# Patient Record
Sex: Female | Born: 1965 | Race: White | Hispanic: No | Marital: Married | Smoking: Never smoker
Health system: Southern US, Community
[De-identification: ages and names within clinical notes are randomized; demographics above are authoritative.]

## PROBLEM LIST (undated history)

## (undated) DIAGNOSIS — IMO0002 Reserved for concepts with insufficient information to code with codable children: Secondary | ICD-10-CM

## (undated) DIAGNOSIS — Z9889 Other specified postprocedural states: Secondary | ICD-10-CM

## (undated) DIAGNOSIS — M069 Rheumatoid arthritis, unspecified: Secondary | ICD-10-CM

## (undated) DIAGNOSIS — M329 Systemic lupus erythematosus, unspecified: Secondary | ICD-10-CM

## (undated) DIAGNOSIS — R112 Nausea with vomiting, unspecified: Secondary | ICD-10-CM

## (undated) DIAGNOSIS — B1920 Unspecified viral hepatitis C without hepatic coma: Secondary | ICD-10-CM

## (undated) DIAGNOSIS — C50919 Malignant neoplasm of unspecified site of unspecified female breast: Secondary | ICD-10-CM

## (undated) DIAGNOSIS — Z9289 Personal history of other medical treatment: Secondary | ICD-10-CM

## (undated) DIAGNOSIS — Z87820 Personal history of traumatic brain injury: Secondary | ICD-10-CM

## (undated) DIAGNOSIS — Z970 Presence of artificial eye: Secondary | ICD-10-CM

## (undated) HISTORY — PX: BRAIN SURGERY: SHX531

## (undated) HISTORY — DX: Rheumatoid arthritis, unspecified: M06.9

## (undated) HISTORY — PX: ANTERIOR CERVICAL DECOMP/DISCECTOMY FUSION: SHX1161

## (undated) HISTORY — DX: Reserved for concepts with insufficient information to code with codable children: IMO0002

## (undated) HISTORY — PX: CLOSED REDUCTION RADIAL HEAD / NECK FRACTURE: SUR238

## (undated) HISTORY — DX: Systemic lupus erythematosus, unspecified: M32.9

---

## 1985-10-01 DIAGNOSIS — Z9289 Personal history of other medical treatment: Secondary | ICD-10-CM

## 1985-10-01 HISTORY — PX: KNEE ARTHROCENTESIS: SUR44

## 1985-10-01 HISTORY — PX: ENUCLEATION: SHX628

## 1985-10-01 HISTORY — DX: Personal history of other medical treatment: Z92.89

## 1985-10-01 HISTORY — PX: EXPLORATORY LAPAROTOMY: SUR591

## 1998-06-01 ENCOUNTER — Other Ambulatory Visit: Admission: RE | Admit: 1998-06-01 | Discharge: 1998-06-01 | Payer: Self-pay | Admitting: Gynecology

## 1999-07-24 ENCOUNTER — Inpatient Hospital Stay (HOSPITAL_COMMUNITY): Admission: AD | Admit: 1999-07-24 | Discharge: 1999-07-24 | Payer: Self-pay | Admitting: Obstetrics and Gynecology

## 1999-07-25 ENCOUNTER — Inpatient Hospital Stay (HOSPITAL_COMMUNITY): Admission: AD | Admit: 1999-07-25 | Discharge: 1999-07-25 | Payer: Self-pay | Admitting: Obstetrics & Gynecology

## 1999-08-06 ENCOUNTER — Inpatient Hospital Stay (HOSPITAL_COMMUNITY): Admission: AD | Admit: 1999-08-06 | Discharge: 1999-08-08 | Payer: Self-pay | Admitting: Obstetrics and Gynecology

## 1999-09-14 ENCOUNTER — Other Ambulatory Visit: Admission: RE | Admit: 1999-09-14 | Discharge: 1999-09-14 | Payer: Self-pay | Admitting: Obstetrics and Gynecology

## 2000-09-12 ENCOUNTER — Other Ambulatory Visit: Admission: RE | Admit: 2000-09-12 | Discharge: 2000-09-12 | Payer: Self-pay | Admitting: Gynecology

## 2002-01-01 ENCOUNTER — Inpatient Hospital Stay (HOSPITAL_COMMUNITY): Admission: AD | Admit: 2002-01-01 | Discharge: 2002-01-03 | Payer: Self-pay | Admitting: Obstetrics and Gynecology

## 2002-02-27 ENCOUNTER — Other Ambulatory Visit: Admission: RE | Admit: 2002-02-27 | Discharge: 2002-02-27 | Payer: Self-pay | Admitting: Obstetrics and Gynecology

## 2003-03-05 ENCOUNTER — Other Ambulatory Visit: Admission: RE | Admit: 2003-03-05 | Discharge: 2003-03-05 | Payer: Self-pay | Admitting: Obstetrics and Gynecology

## 2003-06-25 ENCOUNTER — Ambulatory Visit (HOSPITAL_COMMUNITY): Admission: RE | Admit: 2003-06-25 | Discharge: 2003-06-25 | Payer: Self-pay | Admitting: Gastroenterology

## 2004-05-05 ENCOUNTER — Other Ambulatory Visit: Admission: RE | Admit: 2004-05-05 | Discharge: 2004-05-05 | Payer: Self-pay | Admitting: Obstetrics and Gynecology

## 2004-07-30 ENCOUNTER — Ambulatory Visit (HOSPITAL_COMMUNITY): Admission: RE | Admit: 2004-07-30 | Discharge: 2004-07-30 | Payer: Self-pay | Admitting: Internal Medicine

## 2004-09-18 ENCOUNTER — Ambulatory Visit (HOSPITAL_COMMUNITY): Admission: RE | Admit: 2004-09-18 | Discharge: 2004-09-18 | Payer: Self-pay | Admitting: Neurosurgery

## 2005-09-26 ENCOUNTER — Other Ambulatory Visit: Admission: RE | Admit: 2005-09-26 | Discharge: 2005-09-26 | Payer: Self-pay | Admitting: Obstetrics and Gynecology

## 2006-11-12 ENCOUNTER — Ambulatory Visit: Payer: Self-pay | Admitting: Gastroenterology

## 2006-12-25 ENCOUNTER — Ambulatory Visit (HOSPITAL_COMMUNITY): Admission: RE | Admit: 2006-12-25 | Discharge: 2006-12-25 | Payer: Self-pay | Admitting: Gastroenterology

## 2006-12-25 ENCOUNTER — Encounter (INDEPENDENT_AMBULATORY_CARE_PROVIDER_SITE_OTHER): Payer: Self-pay | Admitting: Specialist

## 2007-03-13 ENCOUNTER — Ambulatory Visit: Payer: Self-pay | Admitting: Gastroenterology

## 2007-06-13 ENCOUNTER — Ambulatory Visit (HOSPITAL_COMMUNITY): Admission: RE | Admit: 2007-06-13 | Discharge: 2007-06-13 | Payer: Self-pay | Admitting: Geriatric Medicine

## 2007-10-20 ENCOUNTER — Encounter: Admission: RE | Admit: 2007-10-20 | Discharge: 2007-10-20 | Payer: Self-pay | Admitting: Gastroenterology

## 2008-10-26 ENCOUNTER — Encounter: Admission: RE | Admit: 2008-10-26 | Discharge: 2008-10-26 | Payer: Self-pay | Admitting: Gastroenterology

## 2010-10-21 ENCOUNTER — Encounter: Payer: Self-pay | Admitting: Internal Medicine

## 2011-02-16 NOTE — Op Note (Signed)
   Kendra Sullivan, Kendra Sullivan                         ACCOUNT NO.:  [id number]   MEDICAL RECORD NO.:  [id number]                   PATIENT TYPE:  AMB   LOCATION:  ENDO                                 FACILITY:  [facility]   PHYSICIAN:  Anselmo Rod, M.D.               DATE OF BIRTH:  08/17/1966   DATE OF PROCEDURE:  06/25/2003  DATE OF DISCHARGE:  06/25/2003                                 OPERATIVE REPORT   PROCEDURE PERFORMED:  Screening colonoscopy.   ENDOSCOPIST:  Charna Elizabeth, M.D.   INSTRUMENT USED:  Olympus video colonoscope.   INDICATIONS FOR PROCEDURE:  Rectal bleeding in a 45 year old white female.  Rule out colonic polyps, masses, etc.  The patient has a family history of  colon cancer  as well.   PREPROCEDURE PREPARATION:  Informed consent was procured from the patient.  The patient was fasted for eight hours prior to the procedure and prepped  with a bottle of magnesium citrate and a gallon of GoLYTELY the night prior  to the procedure.   PREPROCEDURE PHYSICAL:  The patient had stable vital signs.  Neck supple.  Chest clear to auscultation.  S1 and S2 regular.  Abdomen soft with normal  bowel sounds.   DESCRIPTION OF PROCEDURE:  The patient was placed in left lateral decubitus  position and sedated with 70 mg of Demerol and 7 mg of Versed intravenously.  Once the patient was adequately sedated and maintained on low flow oxygen  and continuous cardiac monitoring, the Olympus video colonoscope was  advanced from the rectum to the cecum without difficulty.  The patient had a  fairly good prep, no masses, polyps, erosions, ulcerations or diverticula  were seen.  Small internal hemorrhoids were appreciated on retroflexion in  the rectum. The patient tolerated the procedure well without complications.  Terminal ileum was visualized and appeared normal.   IMPRESSION:  Normal colonoscopy up to the terminal ileum except for small  internal hemorrhoids.   RECOMMENDATIONS:  1.  Continue high fiber diet.  2. Repeat colorectal cancer screening is recommended in the next five years     unless the patient develops any abnormal symptoms in the interim.  3. Outpatient follow-up in the next two weeks or earlier if need be.                                                   Anselmo Rod, M.D.    JNM/MEDQ  D:  06/25/2003  T:  06/28/2003  Job:  [id number]   cc:   Marcelino Duster L. Vincente Poli, M.D.  [address] [place]  [place] [address]  Fax: [phone]

## 2011-02-16 NOTE — Op Note (Signed)
Sullivan, KISTNER               ACCOUNT NO.:  [id number]   MEDICAL RECORD NO.:  [id number]          PATIENT TYPE:  OIB   LOCATION:  3014                         FACILITY:  [facility]   PHYSICIAN:  Kathaleen Maser. Pool, M.D.    DATE OF BIRTH:  11-04-1965   DATE OF PROCEDURE:  09/18/2004  DATE OF DISCHARGE:                                 OPERATIVE REPORT   SERVICE:  Neurosurgery.   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   POSTOPERATIVE DIAGNOSIS:  Right C5-C6 and right C6-C7 herniated pulposus  with radiculopathy.   PROCEDURE PERFORMED:  C5-C6 and C6-C7 anterior discectomy and fusion with  allograft anterior plating.   SURGEON:  Kathaleen Maser. Pool, M.D.   ASSISTANT:  Tia Alert, M.D.   ANESTHESIA:  General endotracheal anesthesia.   INDICATIONS FOR PROCEDURE:  Ms. Theissen is a 45 year old female with a  history of neck and right upper extremity pain associated with both right  sided C6 and C7 radiculopathy failing conservative management.  Workup  demonstrates evidence of significant rightward disc herniation at C5-C6 and  C6-C7 with marked neural foraminal narrowing and stenosis.  Kendra patient  presents now for two level anterior cervical discectomy and fusion with  allograft plating in hopes of improving her symptoms.   PROCEDURE:  Kendra patient was brought to Kendra operating room and placed on Kendra  operating table in Kendra supine position.  After an adequate level of  anesthesia was achieved, Kendra patient was positioned with her neck slightly  extended and held in place with Kendra halter traction.  Kendra patient's anterior  cervical region was prepped and draped sterilely.  A 10 blade was used to  make a linear skin incision overlying Kendra C6 vertebral body.  This was  carried down sharply to Kendra platysma.  Kendra platysma was divided vertically  and dissection proceeded along Kendra medial border of Kendra sternocleidomastoid  muscle and carotid  sheath.  Kendra trachea and esophagus were mobilized and  directed toward Kendra left.  Kendra prevertebral fascia was stripped off Kendra  anterior spinal column.  Kendra longus colli muscles were elevated bilaterally  using electrocautery.  Deep self-retaining retractors were placed.  Interoperative fluoroscopy was used and Kendra C5-C6 and C6-C7 levels were  confirmed.   Kendra disc space was incised at both levels with a 15 blade in a rectangular  fashion and wide disc space clean out was then achieved using pituitary  rongeurs, forward and back cutting Carlens curets, Kerrison rongeurs, and a  high speed drill.  All elements of Kendra disc were removed down to Kendra level  of Kendra posterior annulus.  Kendra microscope was brought onto Kendra field and  used throughout Kendra remainder of Kendra discectomy.  Kendra remaining aspects of  Kendra annulus and osteophytes were removed using Kendra high speed drill down to  Kendra level of Kendra posterior longitudinal ligament.  Kendra posterior  longitudinal ligament was then elevated and resected in piecemeal fashion  using Kerrison rongeurs.  Kendra underlying thecal sac was identified.  A wide  central decompression  was then performed by under cutting Kendra bodies of C5  and C6 using Kerrison rongeurs.  Decompression then proceeded out each  neural foramen.  Wide anterior foraminotomies were then performed along Kendra  course of Kendra exiting C6 nerve roots bilaterally.  All elements of Kendra disc  herniation were completely resected on Kendra patient's right side.  At Kendra  conclusion of Kendra decompression, Kendra thecal sac and nerve roots were well  decompressed and free from injury.  Kendra procedure was then repeated at C6-  C7, again, without complications.   Kendra wound was then irrigated with antibiotic solution, Gelfoam was placed  topically, and hemostasis was found to be good.  A 6 mm patellar wedge  allograft was then impacted into place and recessed approximately 1 mm from  Kendra anterior cortical  margin at both levels.  A 42 mm Atlantis anterior  cervical plate was then placed over Kendra C5, C6 and C7 levels.  This was then  attached under fluoroscopic guidance using 13 mm variable angle screws, two  each at all three levels.  All screws were given final tightening and found  to be solid in bone.  Locking screws were engaged at all three levels.  Kendra  final images revealed good position of bone grafts and hardware.  Kendra wound  was then irrigated with antibiotic solution.  Hemostasis was achieved with  bipolar electrocautery.  Kendra wounds were closed in typical fashion.  Steri-  Strips and sterile dressing were applied.  There were no complications.  Kendra  patient tolerated Kendra procedure well and she was returned to Kendra recovery  room in stable condition.       HAP/MEDQ  D:  09/18/2004  T:  09/18/2004  Job:  [id number]

## 2011-03-28 ENCOUNTER — Emergency Department (HOSPITAL_COMMUNITY): Payer: BC Managed Care – PPO

## 2011-03-28 ENCOUNTER — Emergency Department (HOSPITAL_COMMUNITY)
Admission: EM | Admit: 2011-03-28 | Discharge: 2011-03-29 | Disposition: A | Discharge status: Home / Self Care | Payer: BC Managed Care – PPO | Attending: Emergency Medicine | Admitting: Emergency Medicine

## 2011-03-28 DIAGNOSIS — R05 Cough: Secondary | ICD-10-CM | POA: Insufficient documentation

## 2011-03-28 DIAGNOSIS — J40 Bronchitis, not specified as acute or chronic: Secondary | ICD-10-CM | POA: Insufficient documentation

## 2011-03-28 DIAGNOSIS — R071 Chest pain on breathing: Secondary | ICD-10-CM | POA: Insufficient documentation

## 2011-03-28 DIAGNOSIS — J984 Other disorders of lung: Secondary | ICD-10-CM | POA: Insufficient documentation

## 2011-03-28 DIAGNOSIS — M329 Systemic lupus erythematosus, unspecified: Secondary | ICD-10-CM | POA: Insufficient documentation

## 2011-03-28 DIAGNOSIS — M069 Rheumatoid arthritis, unspecified: Secondary | ICD-10-CM | POA: Insufficient documentation

## 2011-03-28 DIAGNOSIS — R059 Cough, unspecified: Secondary | ICD-10-CM | POA: Insufficient documentation

## 2011-03-28 DIAGNOSIS — B1920 Unspecified viral hepatitis C without hepatic coma: Secondary | ICD-10-CM | POA: Insufficient documentation

## 2011-03-28 DIAGNOSIS — R0602 Shortness of breath: Secondary | ICD-10-CM | POA: Insufficient documentation

## 2011-03-28 LAB — CBC
HCT: 42.2 % (ref 36.0–46.0)
Hemoglobin: 13.4 g/dL (ref 12.0–15.0)
MCH: 29.1 pg (ref 26.0–34.0)
MCHC: 31.8 g/dL (ref 30.0–36.0)
MCV: 91.5 fL (ref 78.0–100.0)
Platelets: 288 10*3/uL (ref 150–400)
RBC: 4.61 MIL/uL (ref 3.87–5.11)
RDW: 13.2 % (ref 11.5–15.5)
WBC: 9 10*3/uL (ref 4.0–10.5)

## 2011-03-28 LAB — TROPONIN I: Troponin I: <0.3 ng/mL (ref <0.30)

## 2011-03-29 ENCOUNTER — Encounter (HOSPITAL_COMMUNITY): Payer: Self-pay

## 2011-03-29 LAB — COMPREHENSIVE METABOLIC PANEL
ALT: 14 U/L (ref 0–35)
AST: 14 U/L (ref 0–37)
Albumin: 3.6 g/dL (ref 3.5–5.2)
Alkaline Phosphatase: 79 U/L (ref 39–117)
BUN: 13 mg/dL (ref 6–23)
CO2: 32 mEq/L (ref 19–32)
Calcium: 9.2 mg/dL (ref 8.4–10.5)
Chloride: 102 mEq/L (ref 96–112)
Creatinine, Ser: 0.8 mg/dL (ref 0.50–1.10)
GFR calc Af Amer: >60 mL/min (ref >60)
GFR calc non Af Amer: >60 mL/min (ref >60)
Glucose, Bld: 97 mg/dL (ref 70–99)
Potassium: 4 mEq/L (ref 3.5–5.1)
Sodium: 141 mEq/L (ref 135–145)
Total Bilirubin: 0.2 mg/dL — ABNORMAL LOW (ref 0.3–1.2)
Total Protein: 7.6 g/dL (ref 6.0–8.3)

## 2011-03-29 LAB — CK TOTAL AND CKMB (NOT AT ARMC)
CK, MB: 1.5 ng/mL (ref 0.3–4.0)
Relative Index: INVALID (ref 0.0–2.5)
Total CK: 32 U/L (ref 7–177)

## 2011-03-29 MED ORDER — IOHEXOL 300 MG/ML  SOLN
80.0000 mL | Freq: Once | INTRAMUSCULAR | Status: AC | PRN
  Administered 2011-03-29: 80 mL via INTRAVENOUS

## 2013-01-22 ENCOUNTER — Other Ambulatory Visit: Payer: Self-pay | Admitting: Obstetrics and Gynecology

## 2014-03-01 HISTORY — PX: BREAST CYST EXCISION: SHX579

## 2014-03-12 ENCOUNTER — Telehealth (INDEPENDENT_AMBULATORY_CARE_PROVIDER_SITE_OTHER): Payer: Self-pay

## 2014-03-12 NOTE — Telephone Encounter (Signed)
I spoke with pt to verify of appt date is ok. Pt states she is fine with this date. She does not want to move to a sooner date with another MD. Pt will bring CD of imaging. Records are here.

## 2014-03-12 NOTE — Telephone Encounter (Signed)
Records here and to new ref coord to make appt with Dr Harlow Asa and return records to me to be attached to date of appt.

## 2014-04-06 ENCOUNTER — Other Ambulatory Visit: Payer: Self-pay | Admitting: Obstetrics and Gynecology

## 2014-04-07 LAB — CYTOLOGY - PAP

## 2014-04-13 ENCOUNTER — Ambulatory Visit (INDEPENDENT_AMBULATORY_CARE_PROVIDER_SITE_OTHER): Payer: Medicaid Other | Admitting: Surgery

## 2014-04-13 ENCOUNTER — Other Ambulatory Visit (INDEPENDENT_AMBULATORY_CARE_PROVIDER_SITE_OTHER): Payer: Self-pay

## 2014-04-13 ENCOUNTER — Telehealth (INDEPENDENT_AMBULATORY_CARE_PROVIDER_SITE_OTHER): Payer: Self-pay

## 2014-04-13 ENCOUNTER — Encounter (INDEPENDENT_AMBULATORY_CARE_PROVIDER_SITE_OTHER): Payer: Self-pay | Admitting: Surgery

## 2014-04-13 VITALS — BP 122/76 | HR 93 | Temp 97.5°F | Ht 66.0 in | Wt 108.0 lb

## 2014-04-13 DIAGNOSIS — C50912 Malignant neoplasm of unspecified site of left female breast: Secondary | ICD-10-CM

## 2014-04-13 DIAGNOSIS — Z170 Estrogen receptor positive status [ER+]: Secondary | ICD-10-CM | POA: Insufficient documentation

## 2014-04-13 DIAGNOSIS — C50419 Malignant neoplasm of upper-outer quadrant of unspecified female breast: Secondary | ICD-10-CM

## 2014-04-13 DIAGNOSIS — C50412 Malignant neoplasm of upper-outer quadrant of left female breast: Secondary | ICD-10-CM

## 2014-04-13 NOTE — Telephone Encounter (Signed)
Copy of records and ref request given to ref coord to set up appt asap with Dr Jana Hakim. Pt has imaging CD with her.

## 2014-04-13 NOTE — Progress Notes (Signed)
[facility]  Chief Complaint  Patient presents with  . New Evaluation    evaluate newly diagnosed left breast cancer - patient referred by her mother, a patient of mine    HISTORY: Patient is a 48 year old female who presents with newly diagnosed left breast invasive ductal carcinoma. Patient had noted a palpable mass in the upper outer quadrant of the left breast. This was initially thought to represent a cyst or a fibroadenoma. Diagnostic studies were performed and the patient was referred to a surgeon. She underwent excisional biopsy under local anesthesia in the office. Final pathology showed invasive ductal carcinoma with positive surgical margins. Tumor measured approximately 2 cm. Receptor studies were performed and showed estrogen receptor positive and progesterone receptor positive.  Do to family concerns, childcare issues, and the patient's past affiliation with the medical community hearing [facility], the patient desires to transfer her care to our community. [name] has a history of breast cancer and has undergone bilateral mastectomy with reconstruction. Her mother accompanies her today.  Patient has had extensive diagnostic studies including bone scan and MRI at an outside facility. She brings a copy of those studies with her today. They showed no sign of metastatic disease.  Past Medical History  Diagnosis Date  . Cancer     Current Outpatient Prescriptions  Medication Sig Dispense Refill  . hydroxychloroquine (PLAQUENIL) 200 MG tablet Take by mouth daily.      . predniSONE (DELTASONE) 10 MG tablet Take 10 mg by mouth daily with breakfast.       No current facility-administered medications for this visit.    Allergies  Allergen Reactions  . Aspirin   . Penicillins   . [place] [Sulfur]     History reviewed. No pertinent family history.  History   Social History  . Marital Status: Married    Spouse Name: N/A   Number of Children: N/A  . Years of Education: N/A   Social History Main Topics  . Smoking status: Never Smoker   . Smokeless tobacco: None  . Alcohol Use: No  . Drug Use: None  . Sexual Activity: None   Other Topics Concern  . None   Social History Narrative  . None    REVIEW OF SYSTEMS - PERTINENT POSITIVES ONLY: Denies pain. Denies further masses. Denies problems with wound healing.  EXAM: Filed Vitals:   04/13/14 1022  BP: 122/76  Pulse: 93  Temp: 97.5 F (36.4 C)    GENERAL: well-developed, well-nourished, very thin, no acute distress HEENT: normocephalic; pupils equal and reactive; sclerae clear; dentition good; mucous membranes moist NECK:  No palpable masses in the thyroid bed; symmetric on extension; no palpable anterior or posterior cervical lymphadenopathy; no supraclavicular masses; no tenderness CHEST: clear to auscultation bilaterally without rales, rhonchi, or wheezes CARDIAC: regular rate and rhythm without significant murmur; peripheral pulses are full BREAST: Left breast has an inverted nipple which is chronic; well-healed 2 cm surgical incision upper-outer quadrant at the edge of the pectoralis major muscle; no sign of infection nor seroma; breast parenchyma is diffusely nodular without discrete or dominant mass; left axilla has palpable 1.5-2 cm mobile soft nontender lymph nodes. Right breast shows an inverted nipple which is chronic; again there is diffusely nodular breast parenchyma without discrete or dominant mass; there is thickening in the upper outer quadrant consistent with fibrocystic change; right axilla is without significant lymphadenopathy. EXT:  non-tender without edema; no deformity NEURO: no gross focal deficits; no  sign of tremor   LABORATORY RESULTS: See [facility] ([facility]) for most recent results  RADIOLOGY RESULTS: See [facility] ([place]) for most recent results  IMPRESSION: Left breast carcinoma, 2 cm, invasive  ductal  PLAN: I had a lengthy discussion with the patient and her mother in the office today. We reviewed her outside medical records and studies. We discussed her potential options for surgical management. Options include partial mastectomy with sentinel lymph node biopsy, total mastectomy with sentinel lymph node biopsy, and bilateral mastectomy with sentinel lymph node biopsy. Reconstruction may be an option if a mastectomy is the desired procedure.  Patient has not been seen by medical oncology. Patient requested see Dr. Gunnar Bulla Magrinat as she has had a relationship to his practice in the past.  We will make referral to the cancer center for this consultation in the immediate future.  We will arrange consultation with Dr. Crissie Reese from plastic surgery after the patient is evaluated at the cancer center.  Patient will likely be presented at the breast cancer conference in the near future.  [name], MD, [facility]  Primary Care Physician: No primary provider on file.

## 2014-04-15 NOTE — Telephone Encounter (Signed)
Pt called stating Kendra Sullivan would not make appt without Dr Jana Hakim reviewing records first. Records faxed to [phone] atten: Kendra Sullivan  per request of pt.

## 2014-04-20 ENCOUNTER — Encounter: Payer: Self-pay | Admitting: *Deleted

## 2014-04-20 ENCOUNTER — Other Ambulatory Visit: Payer: Self-pay | Admitting: *Deleted

## 2014-04-20 DIAGNOSIS — C50412 Malignant neoplasm of upper-outer quadrant of left female breast: Secondary | ICD-10-CM

## 2014-04-20 NOTE — Progress Notes (Signed)
Chart completed, labs entered, added to spreadsheet & placed in Dr. Virgie Dad box.

## 2014-04-21 ENCOUNTER — Other Ambulatory Visit (HOSPITAL_BASED_OUTPATIENT_CLINIC_OR_DEPARTMENT_OTHER): Payer: Medicaid Other

## 2014-04-21 ENCOUNTER — Ambulatory Visit: Payer: BC Managed Care – PPO

## 2014-04-21 ENCOUNTER — Ambulatory Visit (HOSPITAL_BASED_OUTPATIENT_CLINIC_OR_DEPARTMENT_OTHER): Payer: Medicaid Other | Admitting: Oncology

## 2014-04-21 VITALS — BP 139/93 | HR 97 | Temp 98.7°F | Resp 18 | Ht 66.0 in | Wt 111.4 lb

## 2014-04-21 DIAGNOSIS — C50419 Malignant neoplasm of upper-outer quadrant of unspecified female breast: Secondary | ICD-10-CM

## 2014-04-21 DIAGNOSIS — C50412 Malignant neoplasm of upper-outer quadrant of left female breast: Secondary | ICD-10-CM

## 2014-04-21 DIAGNOSIS — Z170 Estrogen receptor positive status [ER+]: Secondary | ICD-10-CM

## 2014-04-21 LAB — COMPREHENSIVE METABOLIC PANEL (CC13)
ALT: 15 U/L (ref 0–55)
AST: 17 U/L (ref 5–34)
Albumin: 3.2 g/dL — ABNORMAL LOW (ref 3.5–5.0)
Alkaline Phosphatase: 64 U/L (ref 40–150)
Anion Gap: 9 mEq/L (ref 3–11)
BUN: 10.2 mg/dL (ref 7.0–26.0)
CO2: 25 mEq/L (ref 22–29)
Calcium: 8.7 mg/dL (ref 8.4–10.4)
Chloride: 107 mEq/L (ref 98–109)
Creatinine: 0.9 mg/dL (ref 0.6–1.1)
Glucose: 107 mg/dl (ref 70–140)
Potassium: 3.6 mEq/L (ref 3.5–5.1)
Sodium: 141 mEq/L (ref 136–145)
Total Bilirubin: 0.2 mg/dL (ref 0.20–1.20)
Total Protein: 7.2 g/dL (ref 6.4–8.3)

## 2014-04-21 LAB — CBC WITH DIFFERENTIAL/PLATELET
BASO%: 0.2 % (ref 0.0–2.0)
Basophils Absolute: 0 10*3/uL (ref 0.0–0.1)
EOS%: 1 % (ref 0.0–7.0)
Eosinophils Absolute: 0.1 10*3/uL (ref 0.0–0.5)
HCT: 34.6 % — ABNORMAL LOW (ref 34.8–46.6)
HGB: 10.6 g/dL — ABNORMAL LOW (ref 11.6–15.9)
LYMPH%: 5.8 % — ABNORMAL LOW (ref 14.0–49.7)
MCH: 24.1 pg — ABNORMAL LOW (ref 25.1–34.0)
MCHC: 30.6 g/dL — ABNORMAL LOW (ref 31.5–36.0)
MCV: 78.6 fL — ABNORMAL LOW (ref 79.5–101.0)
MONO#: 0.3 10*3/uL (ref 0.1–0.9)
MONO%: 2.7 % (ref 0.0–14.0)
NEUT#: 10 10*3/uL — ABNORMAL HIGH (ref 1.5–6.5)
NEUT%: 90.3 % — ABNORMAL HIGH (ref 38.4–76.8)
Platelets: 402 10*3/uL — ABNORMAL HIGH (ref 145–400)
RBC: 4.41 10*6/uL (ref 3.70–5.45)
RDW: 15.4 % — ABNORMAL HIGH (ref 11.2–14.5)
WBC: 11.1 10*3/uL — ABNORMAL HIGH (ref 3.9–10.3)
lymph#: 0.6 10*3/uL — ABNORMAL LOW (ref 0.9–3.3)

## 2014-04-21 NOTE — Progress Notes (Signed)
Kendra Sullivan  [phone] [phone]) [phone]     ID: Kendra Sullivan DOB: 1965-12-27  MR#: [id number]  BSW#:[id number]  PCP: No primary provider on file. Dr [name] GYN: [name] SU: Kendra Sullivan) OTHER MD:   CHIEF COMPLAINT: Newly diagnosed breast cancer  CURRENT TREATMENT: Awaiting surgery   BREAST CANCER HISTORY: Kendra Sullivan herself noted a lump in her left breast, upper outer quadrant, and brought it to the attention of her primary care physician, [name], who initially suspected a fibroadenoma of. Mammography was obtained at [facility] [phone] and a breasts are described as "extremely dense. There were no obvious masses or calcifications. Because of the palpable abnormality and the patient's family history of breast cancer a left breast ultrasound was obtained 01/19/2014. This showed a mass that was taller than wide measuring 1.4 cm, lobulated, [name], but the patient preferred to go directly to excisional biopsy and this was performed at [facility] in [place] 02/19/2014, and showed ([phone]) and invasive ductal carcinoma, grade 2, measuring 2.0 cm, estrogen receptor 92% positive, progesterone receptor 50% positive, both with moderate staining, [name], with an MIB-1 of 11%.  The patient then underwent extensive staging studies including a CT of the chest with contrast 03/03/2014, CT of the abdomen and pelvis with contrast the same day bilateral breast MRI 03/03/2014 showing, in the left breast, no suspicious mass or non-masslike enhancement and no suspicious adenopathy. The right breast appeared normal. Bone scan was also obtained the same day and aside from mild wrist arthritis there was no evidence of metastatic disease.  The patient's subsequent history is as detailed below  INTERVAL HISTORY: Kendra Sullivan was evaluated in the  breast cancer clinic 04/21/2014 accompanied by her mother Kendra Sullivan ([id number]), who is my former patient, with a history of breast cancer.  REiVIEW OF SYSTEMS: Aside from the mass itself, there were no systemic symptoms leading to the initial mammogram. The patient denies unusual headaches, visual changes, nausea, vomiting, stiff neck, dizziness, or gait imbalance. There has been no cough, phlegm production, or pleurisy, no chest pain or pressure, and no change in bowel or bladder habits. The patient denies fever, rash, bleeding, unexplained fatigue or unexplained weight loss. The patient admits to some wrist ankle and finger her pain from her rheumatoid arthritis, but there has been no recent "flare" and overall her disease is well controlled on her hydroxychloroquine and prednisone. A detailed review of systems was otherwise entirely negative.  PAST MEDICAL HISTORY: Past Medical History  Diagnosis Date  . Cancer   . Rheumatoid arthritis   . Lupus     PAST SURGICAL HISTORY: Past Surgical History  Procedure Laterality Date  . Knee arthrocentesis    . Neck dissection    . Closed reduction radial head / neck fracture    . Eye surgery      FAMILY HISTORY No family history on file. As of July of 2015 both of the patient's parents are living, her father being 60, and her mother is 6. As noted, the patient's mother, Kendra Sullivan has a history of breast cancer, diagnosed in her 98s, [name], grade 2, triple positive invasive ductal carcinoma. She received Herceptin for one year and anastrozole for 5. She was discharged from followup in July of 2012. In addition the patient has one brother. She has no  sisters. Aside from the patient's mother, the patient's maternal grandmother was diagnosed with colon cancer at 43 as well as a maternal uncle and 22. Also a maternal cousin was diagnosed with breast cancer at the age of 40.  GYNECOLOGIC  HISTORY:  No LMP recorded. Menarche age 61, first live birth age 45, which the patient understands increases the risk of breast cancer. The patient is [name] P2. Her most recent period was June of 2015 and her periods are still regular.  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 Her husband Kendra Sullivan (goes by [employer]") works as an Chief Financial Officer. They have 2 children, a, Kendra Sullivan, 88, and Kendra Sullivan, 12.    ADVANCED DIRECTIVES: In place   HEALTH MAINTENANCE: History  Substance Use Topics  . Smoking status: Never Smoker   . Smokeless tobacco: Not on file  . Alcohol Use: No     Colonoscopy: Age 1/ under Dr.Robert Reindollar   PAP: July 2015  Bone density:  Lipid panel:  Allergies  Allergen Reactions  . Aspirin   . Penicillins   . Sulphur [Sulfur]     Current Outpatient Prescriptions  Medication Sig Dispense Refill  . hydroxychloroquine (PLAQUENIL) 200 MG tablet Take by mouth daily.      . predniSONE (DELTASONE) 10 MG tablet Take 10 mg by mouth daily with breakfast.       No current facility-administered medications for this visit.    OBJECTIVE: Middle-aged white woman in no acute distress Filed Vitals:   04/21/14 1620  BP: 139/93  Pulse: 97  Temp: 98.7 F (37.1 C)  Resp: 18     Body mass index is 17.99 kg/(m^2).    ECOG FS:1 - Symptomatic but completely ambulatory  Ocular: Sclerae unicteric, pupils equal, round and reactive to light Ear-nose-throat: Oropharynx clear, dentition in good repair Lymphatic: No cervical or supraclavicular adenopathy Lungs no rales or rhonchi, good excursion bilaterally Heart regular rate and rhythm, no murmur appreciated Abd soft, nontender, positive bowel sounds MSK no focal spinal tenderness, no joint edema and specifically no significant deformities over the hands  Neuro: non-focal, well-oriented, appropriate affect Breasts: The right breast is unremarkable. In  the left breast, upper-outer quadrant, there is a scar but no palpable mass. There is no erythema and there are no skin or nipple changes of concern. The left axilla is benign   LAB RESULTS:  CMP     Component Value Date/Time   NA 141 04/21/2014 1609   NA 141 03/28/2011 2145   K 3.6 04/21/2014 1609   K 4.0 03/28/2011 2145   CL 102 03/28/2011 2145   CO2 25 04/21/2014 1609   CO2 32 03/28/2011 2145   GLUCOSE 107 04/21/2014 1609   GLUCOSE 97 03/28/2011 2145   BUN 10.2 04/21/2014 1609   BUN 13 03/28/2011 2145   CREATININE 0.9 04/21/2014 1609   CREATININE 0.80 03/28/2011 2145   CALCIUM 8.7 04/21/2014 1609   CALCIUM 9.2 03/28/2011 2145   PROT 7.2 04/21/2014 1609   PROT 7.6 03/28/2011 2145   ALBUMIN 3.2* 04/21/2014 1609   ALBUMIN 3.6 03/28/2011 2145   AST 17 04/21/2014 1609   AST 14 03/28/2011 2145   ALT 15 04/21/2014 1609   ALT 14 03/28/2011 2145   ALKPHOS 64 04/21/2014 1609   ALKPHOS 79 03/28/2011 2145   BILITOT 0.20 04/21/2014 1609   BILITOT 0.2* 03/28/2011 2145   GFRNONAA >60 03/28/2011 2145   GFRAA >60 03/28/2011  2145    I No results found for this basename: SPEP,  UPEP,   kappa and lambda light chains    Lab Results  Component Value Date   WBC 11.1* 04/21/2014   NEUTROABS 10.0* 04/21/2014   HGB 10.6* 04/21/2014   HCT 34.6* 04/21/2014   MCV 78.6* 04/21/2014   PLT 402* 04/21/2014      Chemistry      Component Value Date/Time   NA 141 04/21/2014 1609   NA 141 03/28/2011 2145   K 3.6 04/21/2014 1609   K 4.0 03/28/2011 2145   CL 102 03/28/2011 2145   CO2 25 04/21/2014 1609   CO2 32 03/28/2011 2145   BUN 10.2 04/21/2014 1609   BUN 13 03/28/2011 2145   CREATININE 0.9 04/21/2014 1609   CREATININE 0.80 03/28/2011 2145      Component Value Date/Time   CALCIUM 8.7 04/21/2014 1609   CALCIUM 9.2 03/28/2011 2145   ALKPHOS 64 04/21/2014 1609   ALKPHOS 79 03/28/2011 2145   AST 17 04/21/2014 1609   AST 14 03/28/2011 2145   ALT 15 04/21/2014 1609   ALT 14 03/28/2011 2145   BILITOT 0.20 04/21/2014 1609   BILITOT  0.2* 03/28/2011 2145       No results found for this basename: LABCA2    No components found with this basename: LABCA125    No results found for this basename: INR,  in the last 168 hours  Urinalysis No results found for this basename: colorurine,  appearanceur,  labspec,  phurine,  glucoseu,  hgbur,  bilirubinur,  ketonesur,  proteinur,  urobilinogen,  nitrite,  leukocytesur    STUDIES: Outside films were reviewed with the patient  ASSESSMENT: 48 y.o. [place] woman status post left upper-outer quadrant excisional biopsy 02/19/2014 of a pT2 cN0, stage IA invasive ductal carcinoma, grade 2, estrogen and progesterone receptor positive, HER-2 negative, with an MIB-1 of 11%  PLAN: We spent the better part of today's hour-long appointment discussing the biology of breast cancer in general, and the specifics of the patient's tumor in particular. Sarha understands in general there is no survival difference between mastectomy compared with lumpectomy plus radiation. The problem is that in a patient with a history of lupus and taking hydrochlorothiazide, radiation may be complicated. Accordingly she is strongly considering left mastectomy to avoid the radiation. If she is going to have left mastectomy, she is very concerned she will be very left-sided with a one-sided reconstruction. This is certainly a concern, and as she gets older in the right breast becomes more pendulous the asymmetry will become more marked. For this reason she, as so many young women in her situation, choose bilateral mastectomies. There is also the fact that after bilateral mastectomy the risk of developing a new breast cancer, which is generally about 1% per year, drops too close to 0.  For all these reasons she has pretty much decided she wants bilateral mastectomies with bilateral reconstruction. That will take care of the local treatment of her breast cancer.  As far as systemic therapy, the pattern of her tumor  strongly suggests a luminal A. type of breast cancer. These generally have a good prognosis and generally do not need or benefit from chemotherapy. We are going to send an Oncotype 2 confirm that she would not benefit from chemotherapy but she understands that my expectation is she will need only anti-estrogens for systemic treatment.  I have made her a return appointment with me around mid September, by  which time I feel she should have had her surgery and will be ready to start thinking about starting antiestrogens. Most likely we will start with tamoxifen and once she goes through menopause-add an additional 2 years of an aromatase inhibitor.  The patient has a good understanding of the overall plan. She agrees with it. She knows the goal of treatment in her case is cure. She will call with any problems that may develop before her next visit here.  Chauncey Cruel, MD   04/22/2014 6:46 AM

## 2014-04-22 ENCOUNTER — Telehealth (INDEPENDENT_AMBULATORY_CARE_PROVIDER_SITE_OTHER): Payer: Self-pay

## 2014-04-22 ENCOUNTER — Encounter: Payer: Self-pay | Admitting: Oncology

## 2014-04-22 NOTE — Telephone Encounter (Signed)
Note sent to Dr Harlow Asa.

## 2014-04-22 NOTE — Telephone Encounter (Signed)
Message copied by Dois Davenport on Thu Apr 22, 2014 10:39 AM ------      Message from: Dois Davenport      Created: Thu Apr 22, 2014 10:38 AM      Contact: [phone]       Will forward to Dr Harlow Asa to review and call pt with recommendation.      Thanks,      Gwynn Burly      ----- Message -----         From: Crisoforo Oxford         Sent: 04/22/2014  10:03 AM           To: Dois Davenport, LPN            Pt called to advise she saw Dr. Jana Hakim yesterday and that you told her to call you after she did for the next steps, pls return call. ty TT       ------

## 2014-04-23 ENCOUNTER — Telehealth: Payer: Self-pay | Admitting: Oncology

## 2014-04-23 NOTE — Telephone Encounter (Signed)
s.w. pt and advised on Sept appt @ 5pm....pt ok and aware

## 2014-04-23 NOTE — Telephone Encounter (Signed)
Pt is calling again wanting to speak with Dr Harlow Asa. Pt advised I spoke with Dr Harlow Asa yesterday about pt.  Dr Harlow Asa is aware and will call pt to discuss treatment plan.

## 2014-04-26 ENCOUNTER — Telehealth (INDEPENDENT_AMBULATORY_CARE_PROVIDER_SITE_OTHER): Payer: Self-pay | Admitting: Surgery

## 2014-04-26 DIAGNOSIS — C50412 Malignant neoplasm of upper-outer quadrant of left female breast: Secondary | ICD-10-CM

## 2014-04-26 NOTE — Telephone Encounter (Signed)
Telephone call to patient to review consultation with Dr. Jana Hakim and confirm her desire to proceed with bilateral mastectomy, sentinel lymph node biopsy, and immediate reconstruction.  Will arrange consultation with Dr. Crissie Reese from plastic surgery.  OR scheduling pending plastic surgery consultation.  [name], MD, [facility] Office: [phone]

## 2014-04-27 ENCOUNTER — Other Ambulatory Visit (INDEPENDENT_AMBULATORY_CARE_PROVIDER_SITE_OTHER): Payer: Self-pay

## 2014-04-27 ENCOUNTER — Telehealth: Payer: Self-pay

## 2014-04-27 ENCOUNTER — Telehealth (INDEPENDENT_AMBULATORY_CARE_PROVIDER_SITE_OTHER): Payer: Self-pay

## 2014-04-27 DIAGNOSIS — C50919 Malignant neoplasm of unspecified site of unspecified female breast: Secondary | ICD-10-CM

## 2014-04-27 NOTE — Telephone Encounter (Signed)
Ref for plastic surgeon in epic and to ref coord to set up date and call pt. Request is for appt asap.

## 2014-04-27 NOTE — Telephone Encounter (Signed)
Returned [employer] - we do not have copy of pathology report that I can find in chart.  Pathology done 02/19/14 [facility].  Angie requested copy of last office note.  Faxed.  Sent to scan.

## 2014-05-03 ENCOUNTER — Ambulatory Visit (HOSPITAL_BASED_OUTPATIENT_CLINIC_OR_DEPARTMENT_OTHER): Payer: Medicaid Other | Admitting: Genetic Counselor

## 2014-05-03 ENCOUNTER — Other Ambulatory Visit: Payer: Medicaid Other

## 2014-05-03 DIAGNOSIS — IMO0002 Reserved for concepts with insufficient information to code with codable children: Secondary | ICD-10-CM

## 2014-05-03 DIAGNOSIS — Z800 Family history of malignant neoplasm of digestive organs: Secondary | ICD-10-CM | POA: Insufficient documentation

## 2014-05-03 DIAGNOSIS — C50919 Malignant neoplasm of unspecified site of unspecified female breast: Secondary | ICD-10-CM | POA: Insufficient documentation

## 2014-05-03 DIAGNOSIS — Z803 Family history of malignant neoplasm of breast: Secondary | ICD-10-CM

## 2014-05-03 NOTE — Progress Notes (Signed)
HISTORY OF PRESENT ILLNESS: Dr. Jana Hakim requested a cancer genetics consultation for Kendra Sullivan, a 48 y.o. female, due to a personal and family history of cancer.  [name] presents to clinic today to discuss the possibility of a hereditary predisposition to cancer, genetic testing, and to further clarify her future cancer risks, as well as potential cancer risk for family members. 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 She is planning bilateral mastectomy and chemotherapy is still to be determined. [name] has no personal history of other types of cancer.   Past Medical History  Diagnosis Date   Cancer    Rheumatoid arthritis    Lupus     Past Surgical History  Procedure Laterality Date   Knee arthrocentesis     Neck dissection     Closed reduction radial head / neck fracture     Eye surgery      History   Social History   Marital Status: Married    Spouse Name: N/A    Number of Children: N/A   Years of Education: N/A   Social History Main Topics   Smoking status: Never Smoker    Smokeless tobacco: Not on file   Alcohol Use: No   Drug Use: Not on file   Sexual Activity: Not on file   Other Topics Concern   Not on file   Social History Narrative   No narrative on file     FAMILY HISTORY:  During the visit, a 4-generation pedigree was obtained. Significant diagnoses include the following:  Family History  Problem Relation Age of Onset   Cancer Mother 58    breast cancer; [name], bilateral per patient   Cancer Maternal Grandmother 42    colorectal   Cancer Cousin 34    mat first cousin with breast cancer   Cancer Cousin 26    pat first cousin with colorectal    Ms. Baumgart's ancestry is of Caucasian descent. There is no known Jewish ancestry or consanguinity.  GENETIC COUNSELING ASSESSMENT: [name] is a 48 y.o. female with a personal and family history of  cancer suggestive of a hereditary predisposition to cancer. We, therefore, discussed and recommended the following at today's visit.   DISCUSSION: We reviewed the characteristics, features and inheritance patterns of hereditary cancer syndromes. We also discussed genetic testing, including the appropriate family members to test, the process of testing, insurance coverage and turn-around-time for results. We discussed the implications of a negative, positive and/or variant of uncertain significant result. We recommended [name] pursue genetic testing for the BreastNext gene panel.   PLAN: Based on our above recommendation, [name] wished to pursue genetic testing and the blood sample was drawn and will be sent to [facility] for analysis. Results should be available within approximately 5 weeks time, at which point they will be disclosed by telephone to [name], as will any additional recommendations warranted by these results. We also encouraged [name] to remain in contact with cancer genetics annually so that we can continuously update the family history and inform her of any changes in cancer genetics and testing that may be of benefit for this family. Ms.  Novitski questions were answered to her satisfaction today. Our contact information was provided should additional questions or concerns arise.   Thank you for the referral and allowing [place] to share in the care of your patient.  The patient was seen for a total of 40 minutes in face-to-face genetic counseling.  This patient was discussed with Magrinat who agrees with the above.    _______________________________________________________________________ For Office Staff:  Number of people involved in session: 2 Was an Intern/ student involved with case: not applicable

## 2014-05-07 ENCOUNTER — Telehealth (INDEPENDENT_AMBULATORY_CARE_PROVIDER_SITE_OTHER): Payer: Self-pay | Admitting: Surgery

## 2014-05-07 ENCOUNTER — Telehealth (INDEPENDENT_AMBULATORY_CARE_PROVIDER_SITE_OTHER): Payer: Self-pay

## 2014-05-07 ENCOUNTER — Other Ambulatory Visit (INDEPENDENT_AMBULATORY_CARE_PROVIDER_SITE_OTHER): Payer: Self-pay | Admitting: Surgery

## 2014-05-07 DIAGNOSIS — C50919 Malignant neoplasm of unspecified site of unspecified female breast: Secondary | ICD-10-CM

## 2014-05-07 NOTE — Telephone Encounter (Signed)
Surgery orders in epic. Scheduling form to coders.

## 2014-05-07 NOTE — Telephone Encounter (Signed)
Pt called stating she did see Dr Harlow Mares and has decided to not have reconstruction at this point. She states Dr Harlow Mares advised her since she was on prednisone she would not be able to have reconstruction until future when she is able to be off steroids. Pt request nipple sparing surgery if this can be done. Pt wants to proceed with scheduling her surgery with Dr Harlow Asa. I have advised pt Dr Harlow Asa is at hospital today but I will forward this request to him.

## 2014-05-07 NOTE — Telephone Encounter (Signed)
Telephone call to patient to discuss operative plans.  She has seen Dr. Jana Hakim and Dr. Harlow Mares.  Wishes to proceed with bilateral mastectomy and left sentinel lymph node biopsy.  Desires nipple sparing procedure with preservation of additional skin for anticipated bilateral reconstruction.  Will submit orders and begin scheduling process and insurance approval process.  [name], MD, [facility] Office: [phone]

## 2014-05-10 ENCOUNTER — Telehealth (INDEPENDENT_AMBULATORY_CARE_PROVIDER_SITE_OTHER): Payer: Self-pay

## 2014-05-10 NOTE — Telephone Encounter (Signed)
Dr Harlow Mares called stating he would like to discuss pts treatment plan. I advised him Dr Harlow Asa is out of office this week but msg will be sent to him for call back. Dr Harlow Mares can be reached at [phone].

## 2014-05-11 ENCOUNTER — Other Ambulatory Visit (INDEPENDENT_AMBULATORY_CARE_PROVIDER_SITE_OTHER): Payer: Self-pay | Admitting: Surgery

## 2014-05-11 DIAGNOSIS — C50912 Malignant neoplasm of unspecified site of left female breast: Secondary | ICD-10-CM

## 2014-05-14 ENCOUNTER — Encounter (HOSPITAL_COMMUNITY)
Admission: RE | Admit: 2014-05-14 | Discharge: 2014-05-14 | Disposition: A | Discharge status: Home / Self Care | Payer: Medicaid Other | Source: Ambulatory Visit | Attending: Surgery | Admitting: Surgery

## 2014-05-14 ENCOUNTER — Encounter (HOSPITAL_COMMUNITY): Payer: Self-pay | Admitting: Pharmacy Technician

## 2014-05-14 ENCOUNTER — Encounter (HOSPITAL_COMMUNITY): Payer: Self-pay

## 2014-05-14 DIAGNOSIS — Z01812 Encounter for preprocedural laboratory examination: Secondary | ICD-10-CM | POA: Diagnosis present

## 2014-05-14 LAB — BASIC METABOLIC PANEL
Anion gap: 12 (ref 5–15)
BUN: 11 mg/dL (ref 6–23)
CALCIUM: 8.9 mg/dL (ref 8.4–10.5)
CO2: 25 meq/L (ref 19–32)
CREATININE: 0.81 mg/dL (ref 0.50–1.10)
Chloride: 104 mEq/L (ref 96–112)
GFR calc Af Amer: >90 mL/min (ref >90)
GFR calc non Af Amer: 85 mL/min — ABNORMAL LOW (ref >90)
GLUCOSE: 92 mg/dL (ref 70–99)
Potassium: 3.5 mEq/L — ABNORMAL LOW (ref 3.7–5.3)
Sodium: 141 mEq/L (ref 137–147)

## 2014-05-14 LAB — CBC
HCT: 32.9 % — ABNORMAL LOW (ref 36.0–46.0)
HEMOGLOBIN: 10.1 g/dL — AB (ref 12.0–15.0)
MCH: 24.5 pg — AB (ref 26.0–34.0)
MCHC: 30.7 g/dL (ref 30.0–36.0)
MCV: 79.7 fL (ref 78.0–100.0)
Platelets: 336 10*3/uL (ref 150–400)
RBC: 4.13 MIL/uL (ref 3.87–5.11)
RDW: 14.5 % (ref 11.5–15.5)
WBC: 8.4 10*3/uL (ref 4.0–10.5)

## 2014-05-14 LAB — HCG, SERUM, QUALITATIVE: Preg, Serum: NEGATIVE

## 2014-05-14 NOTE — Pre-Procedure Instructions (Signed)
Kendra Sullivan  05/14/2014   Your procedure is scheduled on:  05/18/2014  Report to [facility] Admitting-   ENTRANCE A at 5:30 AM.  Call this number if you have problems the morning of surgery: [phone]   Remember:   Do not eat food or drink liquids after midnight.  On Monday night    Take these medicines the morning of surgery with A SIP OF WATER: NOTHING   Do not wear jewelry, make-up or nail polish.  Do not wear lotions, powders, or perfumes. You may wear deodorant.  Do not shave 48 hours prior to surgery.  Do not bring valuables to the hospital.  [facility] is not responsible                  for any belongings or valuables.               Contacts, dentures or bridgework may not be worn into surgery.  Leave suitcase in the car. After surgery it may be brought to your room.  For patients admitted to the hospital, discharge time is determined by your                treatment team.               Patients discharged the day of surgery will not be allowed to drive  home.  Name and phone number of your driver: /w family  Special Instructions: Special Instructions: [facility] - Preparing for Surgery  Before surgery, you can play an important role.  Because skin is not sterile, your skin needs to be as free of germs as possible.  You can reduce the number of germs on you skin by washing with CHG (chlorahexidine gluconate) soap before surgery.  CHG is an antiseptic cleaner which kills germs and bonds with the skin to continue killing germs even after washing.  Please DO NOT use if you have an allergy to CHG or antibacterial soaps.  If your skin becomes reddened/irritated stop using the CHG and inform your nurse when you arrive at Short Stay.  Do not shave (including legs and underarms) for at least 48 hours prior to the first CHG shower.  You may shave your face.  Please follow these instructions carefully:   1.  Shower with CHG Soap the night before surgery and the   morning of Surgery.  2.  If you choose to wash your hair, wash your hair first as usual with your  normal shampoo.  3.  After you shampoo, rinse your hair and body thoroughly to remove the  Shampoo.  4.  Use CHG as you would any other liquid soap.  You can apply chg directly to the skin and wash gently with scrungie or a clean washcloth.  5.  Apply the CHG Soap to your body ONLY FROM THE NECK DOWN.    Do not use on open wounds or open sores.  Avoid contact with your eyes, ears, mouth and genitals (private parts).  Wash genitals (private parts)   with your normal soap.  6.  Wash thoroughly, paying special attention to the area where your surgery will be performed.  7.  Thoroughly rinse your body with warm water from the neck down.  8.  DO NOT shower/wash with your normal soap after using and rinsing off   the CHG Soap.  9.  Pat yourself dry with a clean towel.  10.  Wear clean pajamas.            11.  Place clean sheets on your bed the night of your first shower and do not sleep with pets.  Day of Surgery  Do not apply any lotions/deodorants the morning of surgery.  Please wear clean clothes to the hospital/surgery center.   Please read over the following fact sheets that you were given: Pain Booklet, Coughing and Deep Breathing and Surgical Site Infection Prevention

## 2014-05-17 MED ORDER — CIPROFLOXACIN IN D5W 400 MG/200ML IV SOLN
400.0000 mg | INTRAVENOUS | Status: AC
  Administered 2014-05-18: 400 mg via INTRAVENOUS
  Filled 2014-05-17: qty 200

## 2014-05-17 NOTE — Progress Notes (Signed)
Quick Note:  These results are acceptable for scheduled surgery.  Cali Hope M. Ayham Word, MD, [facility] Office: [phone]   ______ 

## 2014-05-18 ENCOUNTER — Encounter (HOSPITAL_COMMUNITY): Payer: Medicaid Other | Admitting: Anesthesiology

## 2014-05-18 ENCOUNTER — Ambulatory Visit (HOSPITAL_COMMUNITY): Payer: Medicaid Other | Admitting: Anesthesiology

## 2014-05-18 ENCOUNTER — Encounter (HOSPITAL_COMMUNITY): Payer: Self-pay | Admitting: *Deleted

## 2014-05-18 ENCOUNTER — Observation Stay (HOSPITAL_COMMUNITY)
Admission: RE | Admit: 2014-05-18 | Discharge: 2014-05-19 | Disposition: A | Discharge status: Home / Self Care | Payer: Medicaid Other | Source: Ambulatory Visit | Attending: Surgery | Admitting: Surgery

## 2014-05-18 ENCOUNTER — Encounter (HOSPITAL_COMMUNITY)
Admission: RE | Disposition: A | Discharge status: Home / Self Care | Payer: Self-pay | Source: Ambulatory Visit | Attending: Surgery

## 2014-05-18 ENCOUNTER — Ambulatory Visit (HOSPITAL_COMMUNITY)
Admission: RE | Admit: 2014-05-18 | Discharge: 2014-05-18 | Disposition: A | Discharge status: Home / Self Care | Payer: Medicaid Other | Source: Ambulatory Visit | Attending: Surgery | Admitting: Surgery

## 2014-05-18 DIAGNOSIS — M069 Rheumatoid arthritis, unspecified: Secondary | ICD-10-CM | POA: Insufficient documentation

## 2014-05-18 DIAGNOSIS — C50419 Malignant neoplasm of upper-outer quadrant of unspecified female breast: Principal | ICD-10-CM | POA: Insufficient documentation

## 2014-05-18 DIAGNOSIS — B1920 Unspecified viral hepatitis C without hepatic coma: Secondary | ICD-10-CM | POA: Insufficient documentation

## 2014-05-18 DIAGNOSIS — D4860 Neoplasm of uncertain behavior of unspecified breast: Secondary | ICD-10-CM

## 2014-05-18 DIAGNOSIS — C50919 Malignant neoplasm of unspecified site of unspecified female breast: Secondary | ICD-10-CM | POA: Diagnosis present

## 2014-05-18 DIAGNOSIS — M329 Systemic lupus erythematosus, unspecified: Secondary | ICD-10-CM | POA: Diagnosis not present

## 2014-05-18 DIAGNOSIS — Z803 Family history of malignant neoplasm of breast: Secondary | ICD-10-CM | POA: Diagnosis not present

## 2014-05-18 DIAGNOSIS — Z886 Allergy status to analgesic agent status: Secondary | ICD-10-CM | POA: Diagnosis not present

## 2014-05-18 DIAGNOSIS — Z853 Personal history of malignant neoplasm of breast: Secondary | ICD-10-CM

## 2014-05-18 DIAGNOSIS — C773 Secondary and unspecified malignant neoplasm of axilla and upper limb lymph nodes: Secondary | ICD-10-CM | POA: Diagnosis not present

## 2014-05-18 DIAGNOSIS — Z880 Allergy status to penicillin: Secondary | ICD-10-CM | POA: Diagnosis not present

## 2014-05-18 DIAGNOSIS — N6089 Other benign mammary dysplasias of unspecified breast: Secondary | ICD-10-CM | POA: Insufficient documentation

## 2014-05-18 DIAGNOSIS — Z800 Family history of malignant neoplasm of digestive organs: Secondary | ICD-10-CM | POA: Diagnosis not present

## 2014-05-18 DIAGNOSIS — Z882 Allergy status to sulfonamides status: Secondary | ICD-10-CM | POA: Diagnosis not present

## 2014-05-18 DIAGNOSIS — C50412 Malignant neoplasm of upper-outer quadrant of left female breast: Secondary | ICD-10-CM

## 2014-05-18 DIAGNOSIS — Z170 Estrogen receptor positive status [ER+]: Secondary | ICD-10-CM | POA: Diagnosis present

## 2014-05-18 DIAGNOSIS — C50912 Malignant neoplasm of unspecified site of left female breast: Secondary | ICD-10-CM

## 2014-05-18 HISTORY — DX: Malignant neoplasm of unspecified site of unspecified female breast: C50.919

## 2014-05-18 HISTORY — DX: Other specified postprocedural states: Z98.890

## 2014-05-18 HISTORY — DX: Nausea with vomiting, unspecified: R11.2

## 2014-05-18 HISTORY — PX: MASTECTOMY W/ SENTINEL NODE BIOPSY: SHX2001

## 2014-05-18 HISTORY — DX: Personal history of other medical treatment: Z92.89

## 2014-05-18 HISTORY — DX: Unspecified viral hepatitis C without hepatic coma: B19.20

## 2014-05-18 SURGERY — MASTECTOMY WITH SENTINEL LYMPH NODE BIOPSY
Anesthesia: General | Site: Breast | Laterality: Bilateral

## 2014-05-18 MED ORDER — 0.9 % SODIUM CHLORIDE (POUR BTL) OPTIME
TOPICAL | Status: DC | PRN
  Administered 2014-05-18: 1000 mL

## 2014-05-18 MED ORDER — DIPHENHYDRAMINE HCL 50 MG/ML IJ SOLN
INTRAMUSCULAR | Status: AC
  Filled 2014-05-18: qty 1

## 2014-05-18 MED ORDER — LIDOCAINE HCL (CARDIAC) 20 MG/ML IV SOLN
INTRAVENOUS | Status: AC
  Filled 2014-05-18: qty 5

## 2014-05-18 MED ORDER — GLYCOPYRROLATE 0.2 MG/ML IJ SOLN
INTRAMUSCULAR | Status: AC
Start: 2014-05-18 — End: 2014-05-18
  Filled 2014-05-18: qty 4

## 2014-05-18 MED ORDER — LACTATED RINGERS IV SOLN
INTRAVENOUS | Status: DC | PRN
  Administered 2014-05-18 (×2): via INTRAVENOUS

## 2014-05-18 MED ORDER — MEPERIDINE HCL 25 MG/ML IJ SOLN: 6.2500 mg | INTRAMUSCULAR | Status: DC | PRN

## 2014-05-18 MED ORDER — HYDROCORTISONE SOD SUCCINATE 100 MG PF FOR IT USE
INTRAMUSCULAR | Status: DC | PRN
  Administered 2014-05-18: 100 mg via INTRATHECAL

## 2014-05-18 MED ORDER — ROCURONIUM BROMIDE 50 MG/5ML IV SOLN
INTRAVENOUS | Status: AC
  Filled 2014-05-18: qty 1

## 2014-05-18 MED ORDER — HYDROMORPHONE HCL PF 1 MG/ML IJ SOLN
1.0000 mg | INTRAMUSCULAR | Status: DC | PRN
  Administered 2014-05-18: 1 mg via INTRAVENOUS
  Filled 2014-05-18: qty 1

## 2014-05-18 MED ORDER — NEOSTIGMINE METHYLSULFATE 10 MG/10ML IV SOLN
INTRAVENOUS | Status: AC
  Filled 2014-05-18: qty 1

## 2014-05-18 MED ORDER — KCL IN DEXTROSE-NACL 30-5-0.45 MEQ/L-%-% IV SOLN
INTRAVENOUS | Status: DC
  Administered 2014-05-18 – 2014-05-19 (×2): via INTRAVENOUS
  Filled 2014-05-18 (×2): qty 1000

## 2014-05-18 MED ORDER — HYDROCODONE-ACETAMINOPHEN 5-325 MG PO TABS
1.0000 | ORAL_TABLET | ORAL | Status: DC | PRN
  Administered 2014-05-18 – 2014-05-19 (×2): 2 via ORAL
  Filled 2014-05-18 (×2): qty 2

## 2014-05-18 MED ORDER — MIDAZOLAM HCL 2 MG/2ML IJ SOLN
INTRAMUSCULAR | Status: AC
  Filled 2014-05-18: qty 2

## 2014-05-18 MED ORDER — ACETAMINOPHEN 10 MG/ML IV SOLN
INTRAVENOUS | Status: AC
  Filled 2014-05-18: qty 100

## 2014-05-18 MED ORDER — ACETAMINOPHEN 10 MG/ML IV SOLN
INTRAVENOUS | Status: DC | PRN
  Administered 2014-05-18: 1000 mg via INTRAVENOUS

## 2014-05-18 MED ORDER — ACETAMINOPHEN 500 MG PO TABS: 500.0000 mg | ORAL_TABLET | Freq: Four times a day (QID) | ORAL | Status: DC | PRN

## 2014-05-18 MED ORDER — SCOPOLAMINE 1 MG/3DAYS TD PT72
1.0000 | MEDICATED_PATCH | TRANSDERMAL | Status: DC
  Administered 2014-05-18: 1 via TRANSDERMAL

## 2014-05-18 MED ORDER — DIPHENHYDRAMINE HCL 50 MG/ML IJ SOLN
INTRAMUSCULAR | Status: DC | PRN
  Administered 2014-05-18: 12.5 mg via INTRAVENOUS

## 2014-05-18 MED ORDER — PHENYLEPHRINE 40 MCG/ML (10ML) SYRINGE FOR IV PUSH (FOR BLOOD PRESSURE SUPPORT)
PREFILLED_SYRINGE | INTRAVENOUS | Status: AC
  Filled 2014-05-18: qty 10

## 2014-05-18 MED ORDER — MIDAZOLAM HCL 5 MG/5ML IJ SOLN
INTRAMUSCULAR | Status: DC | PRN
  Administered 2014-05-18: 2 mg via INTRAVENOUS

## 2014-05-18 MED ORDER — DEXAMETHASONE SODIUM PHOSPHATE 10 MG/ML IJ SOLN
INTRAMUSCULAR | Status: DC | PRN
  Administered 2014-05-18: 4 mg via INTRAVENOUS

## 2014-05-18 MED ORDER — FENTANYL CITRATE 0.05 MG/ML IJ SOLN: 25.0000 ug | INTRAMUSCULAR | Status: DC | PRN

## 2014-05-18 MED ORDER — LIDOCAINE HCL (CARDIAC) 20 MG/ML IV SOLN
INTRAVENOUS | Status: DC | PRN
  Administered 2014-05-18: 30 mg via INTRAVENOUS

## 2014-05-18 MED ORDER — PROPOFOL 10 MG/ML IV BOLUS
INTRAVENOUS | Status: DC | PRN
  Administered 2014-05-18: 20 mg via INTRAVENOUS
  Administered 2014-05-18: 110 mg via INTRAVENOUS

## 2014-05-18 MED ORDER — ONDANSETRON HCL 4 MG/2ML IJ SOLN: 4.0000 mg | Freq: Four times a day (QID) | INTRAMUSCULAR | Status: DC | PRN

## 2014-05-18 MED ORDER — SCOPOLAMINE 1 MG/3DAYS TD PT72
MEDICATED_PATCH | TRANSDERMAL | Status: AC
  Filled 2014-05-18: qty 1

## 2014-05-18 MED ORDER — PROPOFOL 10 MG/ML IV BOLUS
INTRAVENOUS | Status: AC
  Filled 2014-05-18: qty 20

## 2014-05-18 MED ORDER — GLYCOPYRROLATE 0.2 MG/ML IJ SOLN
INTRAMUSCULAR | Status: DC | PRN
  Administered 2014-05-18: .5 mg via INTRAVENOUS

## 2014-05-18 MED ORDER — SODIUM CHLORIDE 0.9 % IJ SOLN
INTRAMUSCULAR | Status: AC
  Filled 2014-05-18: qty 10

## 2014-05-18 MED ORDER — METHYLENE BLUE 1 % INJ SOLN
INTRAMUSCULAR | Status: AC
  Filled 2014-05-18: qty 10

## 2014-05-18 MED ORDER — ONDANSETRON HCL 4 MG/2ML IJ SOLN
INTRAMUSCULAR | Status: AC
  Filled 2014-05-18: qty 2

## 2014-05-18 MED ORDER — SODIUM CHLORIDE 0.9 % IJ SOLN
INTRAMUSCULAR | Status: DC | PRN
  Administered 2014-05-18: 08:00:00 via INTRAMUSCULAR

## 2014-05-18 MED ORDER — ONDANSETRON HCL 4 MG/2ML IJ SOLN
INTRAMUSCULAR | Status: DC | PRN
  Administered 2014-05-18: 4 mg via INTRAVENOUS

## 2014-05-18 MED ORDER — PREDNISONE 10 MG PO TABS
10.0000 mg | ORAL_TABLET | Freq: Every day | ORAL | Status: DC
  Administered 2014-05-19: 10 mg via ORAL
  Filled 2014-05-18: qty 1

## 2014-05-18 MED ORDER — TECHNETIUM TC 99M SULFUR COLLOID FILTERED
1.0000 | Freq: Once | INTRAVENOUS | Status: AC | PRN
  Administered 2014-05-18: 1 via INTRADERMAL

## 2014-05-18 MED ORDER — HYDROXYCHLOROQUINE SULFATE 200 MG PO TABS
200.0000 mg | ORAL_TABLET | Freq: Every day | ORAL | Status: DC
  Administered 2014-05-18 – 2014-05-19 (×2): 200 mg via ORAL
  Filled 2014-05-18 (×2): qty 1

## 2014-05-18 MED ORDER — PHENYLEPHRINE HCL 10 MG/ML IJ SOLN
INTRAMUSCULAR | Status: DC | PRN
  Administered 2014-05-18: 80 ug via INTRAVENOUS

## 2014-05-18 MED ORDER — ROCURONIUM BROMIDE 100 MG/10ML IV SOLN
INTRAVENOUS | Status: DC | PRN
  Administered 2014-05-18: 10 mg via INTRAVENOUS
  Administered 2014-05-18: 30 mg via INTRAVENOUS
  Administered 2014-05-18: 10 mg via INTRAVENOUS

## 2014-05-18 MED ORDER — HYDROCORTISONE NA SUCCINATE PF 100 MG IJ SOLR
INTRAMUSCULAR | Status: AC
  Filled 2014-05-18: qty 2

## 2014-05-18 MED ORDER — NEOSTIGMINE METHYLSULFATE 10 MG/10ML IV SOLN
INTRAVENOUS | Status: DC | PRN
  Administered 2014-05-18: 2.5 mg via INTRAVENOUS

## 2014-05-18 MED ORDER — PROMETHAZINE HCL 25 MG/ML IJ SOLN: 6.2500 mg | INTRAMUSCULAR | Status: DC | PRN

## 2014-05-18 MED ORDER — DEXAMETHASONE SODIUM PHOSPHATE 10 MG/ML IJ SOLN
INTRAMUSCULAR | Status: AC
  Filled 2014-05-18: qty 1

## 2014-05-18 MED ORDER — FENTANYL CITRATE 0.05 MG/ML IJ SOLN
INTRAMUSCULAR | Status: AC
  Filled 2014-05-18: qty 5

## 2014-05-18 MED ORDER — ONDANSETRON HCL 4 MG PO TABS: 4.0000 mg | ORAL_TABLET | Freq: Four times a day (QID) | ORAL | Status: DC | PRN

## 2014-05-18 MED ORDER — FENTANYL CITRATE 0.05 MG/ML IJ SOLN
INTRAMUSCULAR | Status: DC | PRN
Start: 2014-05-18 — End: 2014-05-18
  Administered 2014-05-18 (×2): 50 ug via INTRAVENOUS
  Administered 2014-05-18: 100 ug via INTRAVENOUS
  Administered 2014-05-18: 50 ug via INTRAVENOUS

## 2014-05-18 SURGICAL SUPPLY — 70 items
ADH SKN CLS APL DERMABOND .7 (GAUZE/BANDAGES/DRESSINGS)
APPLIER CLIP 9.375 MED OPEN (MISCELLANEOUS) ×4
APR CLP MED 9.3 20 MLT OPN (MISCELLANEOUS) ×2
BINDER BREAST LRG (GAUZE/BANDAGES/DRESSINGS) IMPLANT
BINDER BREAST MEDIUM (GAUZE/BANDAGES/DRESSINGS) ×1 IMPLANT
BINDER BREAST XLRG (GAUZE/BANDAGES/DRESSINGS) IMPLANT
BLADE SURG 10 STRL SS (BLADE) ×1 IMPLANT
BLADE SURG 15 STRL LF DISP TIS (BLADE) IMPLANT
BLADE SURG 15 STRL SS (BLADE) ×2
CANISTER SUCTION 2500CC (MISCELLANEOUS) ×2 IMPLANT
CHLORAPREP W/TINT 26ML (MISCELLANEOUS) ×2 IMPLANT
CLIP APPLIE 9.375 MED OPEN (MISCELLANEOUS) IMPLANT
CONT SPEC 4OZ CLIKSEAL STRL BL (MISCELLANEOUS) ×5 IMPLANT
COVER MAYO STAND STRL (DRAPES) ×1 IMPLANT
COVER PROBE W GEL 5X96 (DRAPES) ×1 IMPLANT
COVER SURGICAL LIGHT HANDLE (MISCELLANEOUS) ×2 IMPLANT
DERMABOND ADVANCED (GAUZE/BANDAGES/DRESSINGS)
DERMABOND ADVANCED .7 DNX12 (GAUZE/BANDAGES/DRESSINGS) ×1 IMPLANT
DRAIN CHANNEL 19F RND (DRAIN) ×4 IMPLANT
DRAPE LAPAROSCOPIC ABDOMINAL (DRAPES) ×2 IMPLANT
DRAPE PROXIMA HALF (DRAPES) ×1 IMPLANT
DRAPE UTILITY 15X26 W/TAPE STR (DRAPE) ×4 IMPLANT
ELECT CAUTERY BLADE 6.4 (BLADE) ×2 IMPLANT
ELECT REM PT RETURN 9FT ADLT (ELECTROSURGICAL) ×2
ELECTRODE REM PT RTRN 9FT ADLT (ELECTROSURGICAL) ×1 IMPLANT
EVACUATOR SILICONE 100CC (DRAIN) ×4 IMPLANT
GAUZE XEROFORM 5X9 LF (GAUZE/BANDAGES/DRESSINGS) ×1 IMPLANT
GLOVE BIO SURGEON STRL SZ 6.5 (GLOVE) ×2 IMPLANT
GLOVE BIO SURGEON STRL SZ7 (GLOVE) ×4 IMPLANT
GLOVE BIO SURGEON STRL SZ7.5 (GLOVE) ×2 IMPLANT
GLOVE BIOGEL PI IND STRL 6.5 (GLOVE) IMPLANT
GLOVE BIOGEL PI IND STRL 7.0 (GLOVE) IMPLANT
GLOVE BIOGEL PI IND STRL 7.5 (GLOVE) IMPLANT
GLOVE BIOGEL PI INDICATOR 6.5 (GLOVE) ×1
GLOVE BIOGEL PI INDICATOR 7.0 (GLOVE) ×2
GLOVE BIOGEL PI INDICATOR 7.5 (GLOVE) ×1
GLOVE SURG ORTHO 8.0 STRL STRW (GLOVE) ×3 IMPLANT
GOWN STRL REUS W/ TWL LRG LVL3 (GOWN DISPOSABLE) ×2 IMPLANT
GOWN STRL REUS W/ TWL XL LVL3 (GOWN DISPOSABLE) ×1 IMPLANT
GOWN STRL REUS W/TWL LRG LVL3 (GOWN DISPOSABLE) ×10
GOWN STRL REUS W/TWL XL LVL3 (GOWN DISPOSABLE) ×2
KIT BASIN OR (CUSTOM PROCEDURE TRAY) ×2 IMPLANT
KIT ROOM TURNOVER OR (KITS) ×2 IMPLANT
MARKER SKIN DUAL TIP RULER LAB (MISCELLANEOUS) ×2 IMPLANT
NDL 18GX1X1/2 (RX/OR ONLY) (NEEDLE) ×1 IMPLANT
NDL HYPO 25GX1X1/2 BEV (NEEDLE) ×1 IMPLANT
NEEDLE 18GX1X1/2 (RX/OR ONLY) (NEEDLE) ×2 IMPLANT
NEEDLE HYPO 25GX1X1/2 BEV (NEEDLE) ×2 IMPLANT
NS IRRIG 1000ML POUR BTL (IV SOLUTION) ×3 IMPLANT
PACK GENERAL/GYN (CUSTOM PROCEDURE TRAY) ×2 IMPLANT
PAD ARMBOARD 7.5X6 YLW CONV (MISCELLANEOUS) ×4 IMPLANT
PENCIL BUTTON HOLSTER BLD 10FT (ELECTRODE) ×1 IMPLANT
SPECIMEN JAR LARGE (MISCELLANEOUS) ×2 IMPLANT
SPONGE GAUZE 4X4 12PLY STER LF (GAUZE/BANDAGES/DRESSINGS) ×2 IMPLANT
SPONGE LAP 18X18 X RAY DECT (DISPOSABLE) ×1 IMPLANT
STAPLER VISISTAT 35W (STAPLE) ×2 IMPLANT
SUT ETHILON 3 0 FSL (SUTURE) ×4 IMPLANT
SUT MNCRL AB 4-0 PS2 18 (SUTURE) ×1 IMPLANT
SUT SILK 2 0 FS (SUTURE) ×1 IMPLANT
SUT VIC AB 3-0 SH 18 (SUTURE) ×3 IMPLANT
SUT VIC AB 3-0 SH 27 (SUTURE) ×2
SUT VIC AB 3-0 SH 27XBRD (SUTURE) ×1 IMPLANT
SYR BULB IRRIGATION 50ML (SYRINGE) ×1 IMPLANT
SYR CONTROL 10ML LL (SYRINGE) ×2 IMPLANT
TAPE CLOTH SURG 6X10 WHT LF (GAUZE/BANDAGES/DRESSINGS) ×1 IMPLANT
TOWEL OR 17X24 6PK STRL BLUE (TOWEL DISPOSABLE) ×1 IMPLANT
TOWEL OR 17X26 10 PK STRL BLUE (TOWEL DISPOSABLE) ×2 IMPLANT
TUBE CONNECTING 12X1/4 (SUCTIONS) ×1 IMPLANT
WATER STERILE IRR 1000ML POUR (IV SOLUTION) IMPLANT
YANKAUER SUCT BULB TIP NO VENT (SUCTIONS) ×1 IMPLANT

## 2014-05-18 NOTE — Progress Notes (Signed)
Report given to mark brande rn as caregiver 

## 2014-05-18 NOTE — Brief Op Note (Signed)
05/18/2014  9:59 AM  PATIENT:  Kendra Sullivan  48 y.o. female  PRE-OPERATIVE DIAGNOSIS:  left breast cancer  POST-OPERATIVE DIAGNOSIS:  left breast cancer  PROCEDURE:  Procedure(s): BILATERAL MASTECTOMY WITH LEFT AXILLARY SENTINEL LYMPH NODE BIOPSY USING LYMPHATIC MAPPING AND BLUE DYE INJECTION (Bilateral)  SURGEON:  Surgeon(s) and Role:    * [name], MD - Primary  ASSISTANTS: Sharyn Dross, RNFA   ANESTHESIA:   general  EBL:  Total I/O In: 1000 [I.V.:1000] Out: 100 [Blood:100]  BLOOD ADMINISTERED:none  DRAINS: (3) Blake drain(s) in the subcutaneous space and left axilla   LOCAL MEDICATIONS USED:  NONE  SPECIMEN:  Excision  DISPOSITION OF SPECIMEN:  PATHOLOGY  COUNTS:  YES  TOURNIQUET:  * No tourniquets in log *  DICTATION: .Other Dictation: Dictation Number [id number]  PATIENT DISPOSITION:  PACU - hemodynamically stable.   Delay start of Pharmacological VTE agent (>24hrs) due to surgical blood loss or risk of bleeding: yes  [name], MD, [facility] Office: [phone]

## 2014-05-18 NOTE — Transfer of Care (Signed)
Immediate Anesthesia Transfer of Care Note  Patient: Kendra Sullivan  Procedure(s) Performed: Procedure(s): BILATERAL MASTECTOMY WITH LEFT AXILLARY SENTINEL LYMPH NODE BIOPSY USING LYMPHATIC MAPPING AND BLUE DYE INJECTION (Bilateral)  Patient Location: PACU  Anesthesia Type:General  Level of Consciousness: sedated  Airway & Oxygen Therapy: Patient Spontanous Breathing and Patient connected to nasal cannula oxygen  Post-op Assessment: Report given to PACU RN and Post -op Vital signs reviewed and stable  Post vital signs: stable  Complications: No apparent anesthesia complications
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
## 2014-05-18 NOTE — Anesthesia Postprocedure Evaluation (Signed)
  Anesthesia Post-op Note  Patient: Kendra Sullivan  Procedure(s) Performed: Procedure(s): BILATERAL MASTECTOMY WITH LEFT AXILLARY SENTINEL LYMPH NODE BIOPSY USING LYMPHATIC MAPPING AND BLUE DYE INJECTION (Bilateral)  Patient Location: PACU  Anesthesia Type:General  Level of Consciousness: awake, alert  and oriented  Airway and Oxygen Therapy: Patient Spontanous Breathing  Post-op Pain: mild  Post-op Assessment: Post-op Vital signs reviewed, Patient's Cardiovascular Status Stable and Respiratory Function Stable  Post-op Vital Signs: Reviewed and stable  Last Vitals:  Filed Vitals:   05/18/14 1042  BP: 116/78  Pulse: 75  Temp: 36.6 C  Resp: 14    Complications: No apparent anesthesia complications

## 2014-05-18 NOTE — Anesthesia Postprocedure Evaluation (Signed)
  Anesthesia Post-op Note  Patient: Kendra Sullivan  Procedure(s) Performed: Procedure(s): BILATERAL MASTECTOMY WITH LEFT AXILLARY SENTINEL LYMPH NODE BIOPSY USING LYMPHATIC MAPPING AND BLUE DYE INJECTION (Bilateral)  Patient Location: PACU  Anesthesia Type:General  Level of Consciousness: awake, alert , oriented and patient cooperative  Airway and Oxygen Therapy: Patient Spontanous Breathing and Patient connected to nasal cannula oxygen  Post-op Pain: mild  Post-op Assessment: Post-op Vital signs reviewed, Patient's Cardiovascular Status Stable and Respiratory Function Stable  Post-op Vital Signs: Reviewed and stable  Last Vitals:  Filed Vitals:   05/18/14 1042  BP: 116/78  Pulse: 75  Temp: 36.6 C  Resp: 14    Complications: No apparent anesthesia complications

## 2014-05-18 NOTE — Anesthesia Preprocedure Evaluation (Signed)
Anesthesia Evaluation   Patient awake    Reviewed: Allergy & Precautions, H&P , NPO status , Patient's Chart, lab work & pertinent test results, reviewed documented beta blocker date and time   Airway Mallampati: II TM Distance: >3 FB     Dental  (+) Teeth Intact   Pulmonary  breath sounds clear to auscultation        Cardiovascular Rhythm:Regular Rate:Normal     Neuro/Psych    GI/Hepatic   Endo/Other    Renal/GU      Musculoskeletal   Abdominal (+)  Abdomen: soft.    Peds  Hematology   Anesthesia Other Findings LUPUS on steroids  Reproductive/Obstetrics                           Anesthesia Physical Anesthesia Plan  ASA: II  Anesthesia Plan: General   Post-op Pain Management:    Induction: Intravenous  Airway Management Planned: Oral ETT  Additional Equipment:   Intra-op Plan:   Post-operative Plan: Extubation in OR  Informed Consent: I have reviewed the patients History and Physical, chart, labs and discussed the procedure including the risks, benefits and alternatives for the proposed anesthesia with the patient or authorized representative who has indicated his/her understanding and acceptance.     Plan Discussed with:   Anesthesia Plan Comments: (Needs steroids intra op)        Anesthesia Quick Evaluation

## 2014-05-19 ENCOUNTER — Telehealth (INDEPENDENT_AMBULATORY_CARE_PROVIDER_SITE_OTHER): Payer: Self-pay

## 2014-05-19 ENCOUNTER — Other Ambulatory Visit: Payer: Self-pay | Admitting: Oncology

## 2014-05-19 ENCOUNTER — Encounter (HOSPITAL_COMMUNITY): Payer: Self-pay | Admitting: Surgery

## 2014-05-19 DIAGNOSIS — C50419 Malignant neoplasm of upper-outer quadrant of unspecified female breast: Secondary | ICD-10-CM | POA: Diagnosis not present

## 2014-05-19 MED ORDER — OXYCODONE-ACETAMINOPHEN 5-325 MG PO TABS: 1.0000 | ORAL_TABLET | ORAL | Status: DC | PRN

## 2014-05-19 NOTE — Progress Notes (Signed)
Discharge instructions given to both patient and family, all questions answered, discharged by volunteer services. Patient discharged with JP drains per MD orders and discharge instructions given on dressing care and measurement.

## 2014-05-19 NOTE — Plan of Care (Signed)
Problem: Discharge Progression Outcomes Goal: Tubes and drains discontinued if indicated Outcome: Not Met (add Reason) Discharged with 1 JP drain to R and 2 JP drains to L

## 2014-05-19 NOTE — Discharge Summary (Signed)
Physician Discharge Summary [facility]  Patient ID: Kendra Sullivan MRN: [id number] DOB/AGE: 02-13-1966 48 y.o.  Admit date: 05/18/2014 Discharge date: 05/19/2014  Admission Diagnoses:  Left breast carcinoma  Discharge Diagnoses:  Principal Problem:   Breast cancer of upper-outer quadrant of left female breast Active Problems:   Breast cancer   Discharged Condition: good  Hospital Course: patient admitted for observation after bilateral mastectomy.  Post op course uncomplicated.  Pain well controlled.  Instructed in drain care.  Prepared for discharge on POD#1.  Consults: None  Treatments: surgery: bilateral mastectomy with left axillary SLNBx.  Discharge Exam: Blood pressure 139/71, pulse 74, temperature 97.8 F (36.6 C), temperature source Oral, resp. rate 16, height 5\' 6"  (1.676 m), weight 121 lb 0.5 oz (54.9 kg), last menstrual period 05/16/2014, SpO2 98.00%. HEENT - clear Neck - soft Chest - clear bilaterally Cor - RRR Breast - dressings dry and intact; drains with thin serosanguinous  Disposition: Home  Discharge Instructions   Change dressing (specify)    Complete by:  As directed   Dressing change: drain care as instructed.     Diet - low sodium heart healthy    Complete by:  As directed      Discharge instructions    Complete by:  As directed   [employer] Phone Number [phone]  BREAST BIOPSY/ PARTIAL MASTECTOMY: POST OP INSTRUCTIONS  Always review your discharge instruction sheet given to you by the facility where your surgery was performed.  IF YOU HAVE DISABILITY OR FAMILY LEAVE FORMS, YOU MUST BRING THEM TO THE OFFICE FOR PROCESSING.  DO NOT GIVE THEM TO YOUR DOCTOR.  A prescription for pain medication may be given to you upon discharge.  Take your pain medication as prescribed, if needed.  If narcotic pain medicine is not needed, then you may take acetaminophen (Tylenol) or ibuprofen (Advil) as  needed. Take your usually prescribed medications unless otherwise directed If you need a refill on your pain medication, please contact your pharmacy.  They will contact our office to request authorization.  Prescriptions will not be filled after 5pm or on week-ends. You should eat very light the first 24 hours after surgery, such as soup, crackers, pudding, etc.  Resume your normal diet the day after surgery. Most patients will experience some swelling and bruising in the breast.  Ice packs and a good support bra will help.  Swelling and bruising can take several days to resolve.  It is common to experience some constipation if taking pain medication after surgery.  Increasing fluid intake and taking a stool softener will usually help or prevent this problem from occurring.  A mild laxative (Milk of Magnesia or Miralax) should be taken according to package directions if there are no bowel movements after 48 hours. Unless discharge instructions indicate otherwise, you may remove your bandages 24-48 hours after surgery, and you may shower at that time.  You may have steri-strips (small skin tapes) in place directly over the incision.  These strips should be left on the skin for 7-10 days.  If your surgeon used skin glue on the incision, you may shower in 24 hours.  The glue will flake off over the next 2-3 weeks.  Any sutures or staples will be removed at the office during your follow-up visit. ACTIVITIES:  You may resume regular daily activities (gradually increasing) beginning the next day.  Wearing a good support bra or sports bra minimizes pain and swelling.  You  may have sexual intercourse when it is comfortable. You may drive when you no longer are taking prescription pain medication, you can comfortably wear a seatbelt, and you can safely maneuver your car and apply brakes. RETURN TO WORK:  ______________________________________________________________________________________ Dennis Bast should see your  doctor in the office for a follow-up appointment approximately two weeks after your surgery.  Your doctor's nurse will typically make your follow-up appointment when she calls you with your pathology report.  Expect your pathology report 2-3 business days after your surgery.  You may call to check if you do not hear from [place] after three days. OTHER INSTRUCTIONS: _______________________________________________________________________________________________ _____________________________________________________________________________________________________________________________________ _____________________________________________________________________________________________________________________________________ _____________________________________________________________________________________________________________________________________  WHEN TO CALL YOUR DOCTOR: Fever over 101.0 Nausea and/or vomiting. Extreme swelling or bruising. Continued bleeding from incision. Increased pain, redness, or drainage from the incision.  The clinic staff is available to answer your questions during regular business hours.  Please don't hesitate to call and ask to speak to one of the nurses for clinical concerns.  If you have a medical emergency, go to the nearest emergency room or call 911.  A surgeon from [facility] is always on call at the hospital.  For further questions, please visit [email or web address]     Increase activity slowly    Complete by:  As directed      Leave dressing on - Keep it clean, dry, and intact until clinic visit    Complete by:  As directed             Medication List         acetaminophen 500 MG tablet  Commonly known as:  TYLENOL  Take 500 mg by mouth every 6 (six) hours as needed for mild pain.     hydroxychloroquine 200 MG tablet  Commonly known as:  PLAQUENIL  Take 200 mg by mouth daily.     oxyCODONE-acetaminophen 5-325 MG per  tablet  Commonly known as:  ROXICET  Take 1-2 tablets by mouth every 4 (four) hours as needed for moderate pain.     predniSONE 10 MG tablet  Commonly known as:  DELTASONE  Take 10 mg by mouth daily with breakfast.         [name], MD, [facility] Office: [phone]   Signed: [name] 05/19/2014, 7:58 AM

## 2014-05-19 NOTE — Telephone Encounter (Signed)
Pt home doing well. PO appt made. Pt to call if drainage decreases below 30cc in 24h. Pt can come to office for Nurse appt to have drain removed.

## 2014-05-20 NOTE — Op Note (Signed)
NAMESHERAE, SANTINO NO.:  [id number]  MEDICAL RECORD NO.:  [id number]  LOCATION:                               FACILITY:  [facility]  PHYSICIAN:  [name], MD      DATE OF BIRTH:  26-Jul-1966  DATE OF PROCEDURE:  05/18/2014                              OPERATIVE REPORT   PREOPERATIVE DIAGNOSIS:  Left breast carcinoma.  POSTOPERATIVE DIAGNOSIS:  Left breast carcinoma.  PROCEDURE: 1. Bilateral mastectomy. 2. Sentinel lymph node biopsy, left axilla. 3. Blue dye injection left breast.  SURGEON:  [name], MD, FACS  ASSISTANT:  Sharyn Dross, RNFA  ESTIMATED BLOOD LOSS:  Minimal.  PREPARATION:  ChloraPrep.  COMPLICATIONS:  None.  INDICATIONS:  The patient is a 48 year old female who presents with newly diagnosed left breast invasive ductal carcinoma.  The patient had presented to her local surgeon and underwent an excisional biopsy of a mass in the upper outer quadrant of the left breast which was felt to represent fibroadenoma.  Unfortunately, final pathology showed an invasive ductal carcinoma with positive surgical margins.  Tumor measured approximately 2 cm in size.  The patient transferred care to the [facility].  She was seen in consultation by Medical Oncology and by Plastic Surgery.  After careful consideration, she has elected for bilateral mastectomy and left sentinel lymph node biopsy.  BODY OF REPORT:  Procedure was done in OR #2 at the [place]. [facility].  The patient was brought to the operating room, placed in supine position on the operating room table.  Following administration of general anesthesia, the patient was positioned and then prepped and draped in the usual aseptic fashion.  After ascertaining that an adequate level of anesthesia had been achieved, the left breast was injected with blue dye in a subareolar location.  Breast was massaged.  Using the Neoprobe, the axilla was then scanned for activity.   There was an area of increased activity in the low anterior left axilla.  Incision was made with a #10 blade.  Dissection was carried through subcutaneous tissues.  Gelpi retractors placed for exposure.  The blue lymphatics were identified and tracked to blue lymph nodes.  Lymph nodes were excised.  An additional lymph node tissue was also excised due to the increased size of the lymph nodes which appear to be fatty replaced.  Three radioactive blue lymph nodes were dissected out of the tissue and labeled as sentinel nodes 1, 2, and 3 and submitted to Pathology for review.  Remaining tissue was submitted as additional axillary content.  Good hemostasis was achieved in the axilla and a dry pack was placed in the wound.  Next, we turned our attention to the left breast.  An elliptical incision was made across the anterior breast so as to encompass the nipple-areolar complex.  Using breast hooks, skin flaps were elevated circumferentially to the sternum medially, the clavicle superiorly, the latissimus dorsi laterally, and the inframammary crease and top of the rectus musculature inferiorly.  The breast was then resected off the underlying pectoralis major muscle taking the fascia with the breast. The entire breast is resected.  A suture was used  to mark the axillary tail and a double suture marks the medial aspect of the left breast. Left breast was then submitted to Pathology for review.  Wound was irrigated with warm saline and good hemostasis was noted.  A moist pack was placed in the wound.  With a new set of instruments, the procedure moves to the right breast. Again, an elliptical incision was made so as to encompass the entire nipple-areolar complex.  Breast flaps on the right were elevated with the electrocautery to the usual margins of the clavicle superiorly, the sternum medially, the rectus sheath inferiorly, and the latissimus dorsi laterally.  The breast was then reflected  off the pectoralis muscle from medial to lateral using the electrocautery for hemostasis and taking the fascia with the breast.  The entire breast was resected.  The suture was used to mark the axillary tail and a double suture used to mark the medial aspect of the right breast.  The entire breast was submitted to Pathology for review.  Wounds were irrigated with warm saline and good hemostasis achieved throughout.  Nineteen-French Blake drains were brought in from inferior stab wounds.  Two drains were placed on the left and one drain on the right and they were all secured to the skin with 3-0 nylon sutures. Subcutaneous tissues were then closed with interrupted 3-0 Vicryl sutures.  Skin was closed with stainless steel staples.  Sterile dressings were applied.  The breast binder was applied.  The patient was awakened from anesthesia and brought to the recovery room.  The patient tolerated the procedure well.   [name], MD, [facility] Office: [phone]     TMG/MEDQ  D:  05/18/2014  T:  05/18/2014  Job:  [id number]  cc:   Chauncey Cruel, M.D. Crissie Reese, M.D.

## 2014-05-20 NOTE — Op Note (Deleted)
NAMEMORGAN, KEINATH NO.:  [id number]  MEDICAL RECORD NO.:  [id number]  LOCATION:                               FACILITY:  [facility]  PHYSICIAN:  [name], MD      DATE OF BIRTH:  25-Sep-1966  DATE OF PROCEDURE:  05/18/2014                              OPERATIVE REPORT   PREOPERATIVE DIAGNOSIS:  Left breast carcinoma.  POSTOPERATIVE DIAGNOSIS:  Left breast carcinoma.  PROCEDURE: 1. Bilateral mastectomy. 2. Sentinel lymph node biopsy, left axilla. 3. Blue dye injection left breast.  SURGEON:  [name], MD, FACS  ASSISTANT:  Sharyn Dross, RNFA  ESTIMATED BLOOD LOSS:  Minimal.  PREPARATION:  ChloraPrep.  COMPLICATIONS:  None.  INDICATIONS:  The patient is a 48 year old female who presents with newly diagnosed left breast invasive ductal carcinoma.  The patient had presented to her local surgeon and underwent an excisional biopsy of a mass in the upper outer quadrant of the left breast which was felt to represent fibroadenoma.  Unfortunately, final pathology showed an invasive ductal carcinoma with positive surgical margins.  Tumor measured approximately 2 cm in size.  The patient transferred care to the [facility].  She was seen in consultation by Medical Oncology and by Plastic Surgery.  After careful consideration, she has elected for bilateral mastectomy and left sentinel lymph node biopsy.  BODY OF REPORT:  Procedure was done in OR #2 at the [place]. [facility].  The patient was brought to the operating room, placed in supine position on the operating room table.  Following administration of general anesthesia, the patient was positioned and then prepped and draped in the usual aseptic fashion.  After ascertaining that an adequate level of anesthesia had been achieved, the left breast was injected with blue dye in a subareolar location.  Breast was massaged.  Using the Neoprobe, the axilla was then scanned for activity.   There was an area of increased activity in the low anterior left axilla.  Incision was made with a #10 blade.  Dissection was carried through subcutaneous tissues.  Gelpi retractors placed for exposure.  The blue lymphatics were identified and tracked to blue lymph nodes.  Lymph nodes were excised.  An additional lymph node tissue was also excised due to the increased size of the lymph nodes which appear to be fatty replaced.  Three radioactive blue lymph nodes were dissected out of the tissue and labeled as sentinel nodes 1, 2, and 3 and submitted to Pathology for review.  Remaining tissue was submitted as additional axillary content.  Good hemostasis was achieved in the axilla and a dry pack was placed in the wound.  Next, we turned our attention to the left breast.  An elliptical incision was made across the anterior breast so as to encompass the nipple-areolar complex.  Using breast hooks, skin flaps were elevated circumferentially to the sternum medially, the clavicle superiorly, the latissimus dorsi laterally, and the inframammary crease and top of the rectus musculature inferiorly.  The breast was then resected off the underlying pectoralis major muscle taking the fascia with the breast. The entire breast is resected.  A suture was used  to mark the axillary tail and a double suture marks the medial aspect of the left breast. Left breast was then submitted to Pathology for review.  Wound was irrigated with warm saline and good hemostasis was noted.  A moist pack was placed in the wound.  With a new set of instruments, the procedure moves to the right breast. Again, an elliptical incision was made so as to encompass the entire nipple-areolar complex.  Breast flaps on the right were elevated with the electrocautery to the usual margins of the clavicle superiorly, the sternum medially, the rectus sheath inferiorly, and the latissimus dorsi laterally.  The breast was then reflected  off the pectoralis muscle from medial to lateral using the electrocautery for hemostasis and taking the fascia with the breast.  The entire breast was resected.  The suture was used to mark the axillary tail and a double suture used to mark the medial aspect of the right breast.  The entire breast was submitted to Pathology for review.  Wounds were irrigated with warm saline and good hemostasis achieved throughout.  Nineteen-French Blake drains were brought in from inferior stab wounds.  Two drains were placed on the left and one drain on the right and they were all secured to the skin with 3-0 nylon sutures. Subcutaneous tissues were then closed with interrupted 3-0 Vicryl sutures.  Skin was closed with stainless steel staples.  Sterile dressings were applied.  The breast binder was applied.  The patient was awakened from anesthesia and brought to the recovery room.  The patient tolerated the procedure well.   [name], MD, [facility] Office: [phone]     TMG/MEDQ  D:  05/18/2014  T:  05/18/2014  Job:  [id number]  cc:   Chauncey Cruel, M.D. Crissie Reese, M.D.

## 2014-05-21 ENCOUNTER — Encounter (HOSPITAL_COMMUNITY): Payer: Self-pay | Admitting: Anesthesiology

## 2014-05-21 ENCOUNTER — Encounter (HOSPITAL_COMMUNITY): Payer: Self-pay | Admitting: Surgery

## 2014-05-21 NOTE — Addendum Note (Signed)
Addendum created 05/21/14 1212 by Alexis Frock, MD   Modules edited: Anesthesia Attestations, Anesthesia Events, Anesthesia Responsible Staff

## 2014-05-25 ENCOUNTER — Other Ambulatory Visit: Payer: Self-pay | Admitting: Oncology

## 2014-05-26 ENCOUNTER — Encounter (INDEPENDENT_AMBULATORY_CARE_PROVIDER_SITE_OTHER): Payer: Self-pay | Admitting: Surgery

## 2014-05-27 ENCOUNTER — Encounter (INDEPENDENT_AMBULATORY_CARE_PROVIDER_SITE_OTHER): Payer: Self-pay | Admitting: Surgery

## 2014-05-27 ENCOUNTER — Other Ambulatory Visit: Payer: Self-pay | Admitting: Oncology

## 2014-05-27 ENCOUNTER — Ambulatory Visit (INDEPENDENT_AMBULATORY_CARE_PROVIDER_SITE_OTHER): Payer: Medicaid Other | Admitting: Surgery

## 2014-05-27 VITALS — BP 120/66 | HR 108 | Temp 98.7°F | Resp 16 | Ht 67.0 in | Wt 110.4 lb

## 2014-05-27 DIAGNOSIS — C50419 Malignant neoplasm of upper-outer quadrant of unspecified female breast: Secondary | ICD-10-CM

## 2014-05-27 DIAGNOSIS — C50412 Malignant neoplasm of upper-outer quadrant of left female breast: Secondary | ICD-10-CM

## 2014-05-27 DIAGNOSIS — C50919 Malignant neoplasm of unspecified site of unspecified female breast: Secondary | ICD-10-CM

## 2014-05-27 NOTE — Progress Notes (Signed)
General Surgery [facility]  Chief Complaint  Patient presents with  . Routine Post Op    reck masty site and check drains    HISTORY: Patient is a 48 year old female who underwent bilateral mastectomy and left axillary sentinel lymph node biopsy last week. She returns today for an initial wound check and drain removal.  Anterior drains have minimal serous output. Left axillary drain continues to put out slightly over 30 cc per day. Both anterior drains will be removed. Axillary drain will be left in place for an additional 4 days.  EXAM: Dressings are removed. Skin integrity is good. No sign of ischemia. Minimal ecchymosis. Every other staple from the incisions are removed. Anterior drains are removed and dry gauze dressings are placed. Left axillary drain is left in position.  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 She may begin showers tomorrow evening. She will return in 4 days for wound check, staple removal, and removal of her remaining drain.  [name], MD, [facility]   Visit Diagnoses: 1. Malignant neoplasm of breast (female), unspecified site   2. Breast cancer of upper-outer quadrant of left female breast

## 2014-05-31 ENCOUNTER — Ambulatory Visit (INDEPENDENT_AMBULATORY_CARE_PROVIDER_SITE_OTHER): Payer: Medicaid Other

## 2014-05-31 ENCOUNTER — Telehealth (INDEPENDENT_AMBULATORY_CARE_PROVIDER_SITE_OTHER): Payer: Self-pay

## 2014-05-31 ENCOUNTER — Other Ambulatory Visit (INDEPENDENT_AMBULATORY_CARE_PROVIDER_SITE_OTHER): Payer: Self-pay

## 2014-05-31 DIAGNOSIS — T814XXA Infection following a procedure, initial encounter: Principal | ICD-10-CM

## 2014-05-31 DIAGNOSIS — G8918 Other acute postprocedural pain: Secondary | ICD-10-CM

## 2014-05-31 DIAGNOSIS — Z4802 Encounter for removal of sutures: Secondary | ICD-10-CM

## 2014-05-31 DIAGNOSIS — IMO0001 Reserved for inherently not codable concepts without codable children: Secondary | ICD-10-CM

## 2014-05-31 MED ORDER — TRAMADOL HCL 50 MG PO TABS
50.0000 mg | ORAL_TABLET | Freq: Four times a day (QID) | ORAL | Status: DC | PRN
Start: 2014-05-31 — End: 2014-07-02

## 2014-05-31 NOTE — Progress Notes (Signed)
Pt in office for staple and drain removal per order by Dr Harlow Asa. Wd clean and dry. No redness. No swelling.  Healing well. Drainage 20cc in 24 hours. Drainage noted to be yellow and sightly thick approx 5cc. I had Dr Hulen Skains ck wd and evaluate drainage in bulb. Per his order drain was removed and fluid sent for culture. Staples removed and steri strips applied. Pt will monitor wd and call if redness,swelling or fever occur. Culture sent to lab. Pt has upcoming appt with Dr Harlow Asa and oncology.

## 2014-05-31 NOTE — Telephone Encounter (Signed)
Pt was into day for staple and drain removal. Pt requesting refill of pain med. Pt requesting tramadol. Reviewed with Dr Harlow Asa via phone. Per his order tramadol 50 mg #30 called to [facility]. Pt aware.

## 2014-06-02 ENCOUNTER — Encounter: Payer: Self-pay | Admitting: Genetic Counselor

## 2014-06-02 DIAGNOSIS — Z803 Family history of malignant neoplasm of breast: Secondary | ICD-10-CM

## 2014-06-02 DIAGNOSIS — C50919 Malignant neoplasm of unspecified site of unspecified female breast: Secondary | ICD-10-CM

## 2014-06-02 DIAGNOSIS — Z800 Family history of malignant neoplasm of digestive organs: Secondary | ICD-10-CM

## 2014-06-02 NOTE — Progress Notes (Signed)
HPI:  Ms. Sergent was previously seen in the [place] clinic due to a personal and family history of cancer and concerns regarding a hereditary predisposition to cancer. Please refer to our prior cancer genetics clinic note for more information regarding Ms. Buth's medical, social and family histories, and our assessment and recommendations, at the time. Ms. Feher recent genetic test results were disclosed to her, as were recommendations warranted by these results. These results and recommendations are discussed in more detail below.  GENETIC TEST RESULTS: At the time of Ms. Ruffins's visit, we recommended she pursue genetic testing of the BreastNext gene panel. This test, which included sequencing and deletion/duplication analysis of the genes, was performed at [facility]. Genetic testing was normal, and did not reveal a deleterious mutation in these genes. A complete list of all genes tested is located on the test report scanned into [employer].    We discussed with Ms. Remick that since the current genetic testing is not perfect, it is possible there may be a gene mutation in one of these genes that current testing cannot detect, but that chance is small.  We also discussed, that it is possible that another gene that has not yet been discovered, or that we have not yet tested, is responsible for the cancer diagnoses in the family, and it is, therefore, important to remain in touch with cancer genetics in the future so that we can continue to offer Ms. Leflore the most up to date genetic testing.   CANCER SCREENING RECOMMENDATIONS: This result is reassuring and suggests that Ms. Loredo's cancer was most likely not due to an inherited predisposition associated with one of these genes.  Most cancers happen by chance and this negative test, along with details of her family history, suggests that her cancer falls into this category.  We, therefore, recommended she continue  to follow the cancer management and screening guidelines provided by her oncology and primary providers.   RECOMMENDATIONS FOR FAMILY MEMBERS:  While these results are reassuring for [name], this test does not tell us anything about Ms. Torain's maternal realtives' risks. We recommended these relatives also have genetic counseling. Please let us know if we can help facilitate testing. Genetic counselors can be located in other cities, by visiting the website of the [employer] ([employer]) and Field seismologist for a Dietitian by zip code.    FOLLOW-UP: Lastly, we discussed with Ms. Outland that cancer genetics is a rapidly advancing field and it is possible that new genetic tests will be appropriate for her and/or her family members in the future. We encouraged her to remain in contact with cancer genetics on an annual basis so we can update her personal and family histories and let her know of advances in cancer genetics that may benefit this family.   Our contact number was provided. Ms. Abeln questions were answered to her satisfaction, and she knows she is welcome to call us at anytime with additional questions or concerns.   Catherine A. Fine, MS, CGC Certified Genetic Counseor [email or web address]

## 2014-06-03 LAB — WOUND CULTURE: GRAM STAIN: NONE SEEN

## 2014-06-21 ENCOUNTER — Ambulatory Visit (HOSPITAL_BASED_OUTPATIENT_CLINIC_OR_DEPARTMENT_OTHER): Payer: Medicaid Other | Admitting: Oncology

## 2014-06-21 VITALS — BP 115/75 | HR 67 | Temp 98.2°F | Resp 18 | Ht 67.0 in | Wt 109.3 lb

## 2014-06-21 DIAGNOSIS — C50419 Malignant neoplasm of upper-outer quadrant of unspecified female breast: Secondary | ICD-10-CM

## 2014-06-21 DIAGNOSIS — C50412 Malignant neoplasm of upper-outer quadrant of left female breast: Secondary | ICD-10-CM

## 2014-06-21 DIAGNOSIS — Z170 Estrogen receptor positive status [ER+]: Secondary | ICD-10-CM

## 2014-06-21 DIAGNOSIS — D0590 Unspecified type of carcinoma in situ of unspecified breast: Secondary | ICD-10-CM
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
## 2014-06-22 ENCOUNTER — Encounter: Payer: Self-pay | Admitting: *Deleted

## 2014-06-22 ENCOUNTER — Telehealth: Payer: Self-pay | Admitting: Oncology

## 2014-06-22 NOTE — Telephone Encounter (Signed)
per pof to sch pt appt-sch and cld pt & left message for appt time & date

## 2014-06-22 NOTE — Progress Notes (Signed)
Received order for oncotype dx testing. Requisition sent to pathology. Received by Christy. 

## 2014-06-23 ENCOUNTER — Other Ambulatory Visit: Payer: Self-pay | Admitting: Oncology

## 2014-06-23 NOTE — Addendum Note (Signed)
Addended by: Jaci Carrel A on: 06/23/2014 09:06 AM   Modules accepted: Orders

## 2014-06-23 NOTE — Progress Notes (Signed)
Oncotype cancelled per Dr. Jana Hakim.

## 2014-06-25 NOTE — Progress Notes (Signed)
Location of Breast Cancer: left breast, upper outer  Histology per Pathology Report:  05/18/14 Diagnosis 1. Lymph node, sentinel, biopsy, Left axillary - ONE LYMPH NODE, NEGATIVE FOR TUMOR (0/1). 2. Lymph node, sentinel, biopsy, Left axillary #2 - ONE LYMPH NODE, NEGATIVE FOR TUMOR (0/1). 3. Lymph node, sentinel, biopsy, Left axillary #3 - ONE LYMPH NODE, POSITIVE FOR METASTATIC MAMMARY CARCINOMA (1/1). - TUMOR DEPOSIT IS 8 MM. - POSITIVE FOR EXTRACAPSULAR TUMOR EXTENSION. 4. Lymph nodes, regional resection, Additional left axillary contents - THREE LYMPH NODES, NEGATIVE FOR TUMOR (0/3). 5. Breast, simple mastectomy, Left breast - NEGATIVE FOR INVASIVE MALIGNANCY, SEE COMMENT. - LOBULAR NEOPLASIA (ATYPICAL LOBULAR HYPERPLASIA AND IN SITU CARCINOMA). - SEE TUMOR SYNOPTIC TEMPLATE BELOW. 6. Breast, simple mastectomy, Right breast - LOBULAR NEOPLASIA (ATYPICAL LOBULAR HYPERPLASIA AND IN SITU CARCINOMA), SEE COMMENT. - SEBACEOUS HYPERPLASIA OF NIPPLE. - SURGICAL MARGINS, NEGATIVE FOR ATYPIA OR MALIGNANCY. - SEE TUMOR SYNOPTIC TEMPLATE BELOW.  03/14/69 Left upper outer excisional biopsy: T2, N0, stage Ia invasive ductal carcinoma  Receptor Status: ER(92%), PR (50%), Her2-neu (-)  Did patient present with symptoms (if so, please note symptoms) or was this found on screening mammography?: patient noticed a lump in her left breast  Past/Anticipated interventions by surgeon, if any: bilateral simple mastectomies, left axillary SN sampling   Past/Anticipated interventions by medical oncology, if any: Chemotherapy, Dr Jana Hakim: My recommendation to her as far as systemic treatment is concerned, is that she participate in the study if possible. Either way, whether she receives chemotherapy or not, she understands she would need antiestrogen therapy later.  Lymphedema issues, if any:   no  Pain issues, if any:  no  SAFETY ISSUES:  Prior radiation? no  Pacemaker/ICD? no  Possible  current pregnancy? no  Is the patient on methotrexate? no  Current Complaints / other details:  Married, works occasionally as Oceanographer Menarche age 70, 49st live birth age 53, P39, one son, one daughter Mother w/hx breast cancer, diagnosed in her 61's. Maternal cousin diagnosed w/breast cancer at age 25.    Andria Rhein, RN 06/25/2014,10:00 AM

## 2014-06-29 ENCOUNTER — Ambulatory Visit
Admission: RE | Admit: 2014-06-29 | Discharge: 2014-06-29 | Disposition: A | Discharge status: Home / Self Care | Payer: Medicaid Other | Source: Ambulatory Visit | Attending: Radiation Oncology | Admitting: Radiation Oncology

## 2014-06-29 ENCOUNTER — Encounter: Payer: Self-pay | Admitting: Radiation Oncology

## 2014-06-29 VITALS — BP 108/69 | HR 93 | Temp 98.9°F | Resp 20 | Ht 67.0 in | Wt 111.0 lb

## 2014-06-29 DIAGNOSIS — C50419 Malignant neoplasm of upper-outer quadrant of unspecified female breast: Secondary | ICD-10-CM | POA: Diagnosis not present

## 2014-06-29 DIAGNOSIS — IMO0002 Reserved for concepts with insufficient information to code with codable children: Secondary | ICD-10-CM | POA: Insufficient documentation

## 2014-06-29 DIAGNOSIS — Z9001 Acquired absence of eye: Secondary | ICD-10-CM | POA: Insufficient documentation

## 2014-06-29 DIAGNOSIS — M329 Systemic lupus erythematosus, unspecified: Secondary | ICD-10-CM | POA: Insufficient documentation

## 2014-06-29 DIAGNOSIS — Z170 Estrogen receptor positive status [ER+]: Secondary | ICD-10-CM | POA: Insufficient documentation

## 2014-06-29 DIAGNOSIS — C779 Secondary and unspecified malignant neoplasm of lymph node, unspecified: Secondary | ICD-10-CM | POA: Insufficient documentation

## 2014-06-29 DIAGNOSIS — C50412 Malignant neoplasm of upper-outer quadrant of left female breast: Secondary | ICD-10-CM

## 2014-06-29 DIAGNOSIS — Z9010 Acquired absence of unspecified breast and nipple: Secondary | ICD-10-CM | POA: Insufficient documentation

## 2014-06-29 DIAGNOSIS — B1920 Unspecified viral hepatitis C without hepatic coma: Secondary | ICD-10-CM | POA: Insufficient documentation

## 2014-06-29 DIAGNOSIS — M069 Rheumatoid arthritis, unspecified: Secondary | ICD-10-CM | POA: Diagnosis not present

## 2014-06-29 DIAGNOSIS — Z510 Encounter for antineoplastic radiation therapy: Secondary | ICD-10-CM | POA: Insufficient documentation
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
## 2014-06-29 NOTE — Progress Notes (Signed)
Please see the Nurse Progress Note in the MD Initial Consult Encounter for this patient. 

## 2014-06-30 ENCOUNTER — Other Ambulatory Visit: Payer: Self-pay | Admitting: Oncology

## 2014-06-30 ENCOUNTER — Telehealth: Payer: Self-pay | Admitting: *Deleted

## 2014-06-30 NOTE — Telephone Encounter (Signed)
On 06-30-14 carry a cd to Dr. Jana Hakim that from [place]

## 2014-07-01 ENCOUNTER — Encounter (INDEPENDENT_AMBULATORY_CARE_PROVIDER_SITE_OTHER): Payer: Medicaid Other | Admitting: Surgery

## 2014-07-01 ENCOUNTER — Other Ambulatory Visit (INDEPENDENT_AMBULATORY_CARE_PROVIDER_SITE_OTHER): Payer: Self-pay | Admitting: Surgery

## 2014-07-02 ENCOUNTER — Encounter (HOSPITAL_COMMUNITY): Payer: Self-pay | Admitting: Pharmacy Technician

## 2014-07-02 ENCOUNTER — Other Ambulatory Visit: Payer: Self-pay | Admitting: *Deleted

## 2014-07-02 NOTE — Progress Notes (Signed)
Message left on VM from pt stating " I am returning a call from Dr Vinie Sill ".  Pt left return call number as [phone].  This RN reviewed chart and did not see prior note regarding reason for call.  This RN returned call to pt and obtained identified VM for pt - message left informing pt above will be given to MD for review and appropriate followup.

## 2014-07-05 ENCOUNTER — Other Ambulatory Visit: Payer: Self-pay | Admitting: Oncology
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
## 2014-07-07 ENCOUNTER — Inpatient Hospital Stay (HOSPITAL_COMMUNITY): Admission: RE | Admit: 2014-07-07 | Payer: Medicaid Other | Source: Ambulatory Visit

## 2014-07-07 ENCOUNTER — Telehealth: Payer: Self-pay | Admitting: Oncology

## 2014-07-07 NOTE — Telephone Encounter (Signed)
per pof to [facility] pt appt-sent MW email to sch trmt-will call pt after reply

## 2014-07-08 ENCOUNTER — Telehealth: Payer: Self-pay | Admitting: *Deleted

## 2014-07-08 ENCOUNTER — Other Ambulatory Visit: Payer: Self-pay | Admitting: *Deleted

## 2014-07-08 NOTE — Telephone Encounter (Signed)
Per staff message and POF I have scheduled appts. Advised scheduler of appts. JMW  

## 2014-07-09 ENCOUNTER — Other Ambulatory Visit: Payer: Self-pay | Admitting: *Deleted

## 2014-07-09 DIAGNOSIS — C50412 Malignant neoplasm of upper-outer quadrant of left female breast: Secondary | ICD-10-CM

## 2014-07-09 NOTE — Patient Instructions (Signed)
KRISTEN FROMM  07/09/2014                           YOUR PROCEDURE IS SCHEDULED ON:                 LOOK FOR RED SIGN ON [address] AND FOLLOW SIGNS                CALL SHORT STAY AT [phone] TO UNLOCK DOORS                FOLLOW  SIGNS TO SHORT STAY CENTER                 ARRIVE AT SHORT STAY AT:  5:30 AM               CALL THIS NUMBER IF ANY PROBLEMS THE DAY OF SURGERY :               [phone]                                REMEMBER:   Do not eat food or drink liquids AFTER MIDNIGHT              STOP ASPIRIN / NSAIDS AND HERBAL MEDS 5 DAYS PEOP                  Take these medicines the morning of surgery with               A SIPS OF WATER :         Do not wear jewelry, make-up   Do not wear lotions, powders, or perfumes.   Do not shave legs or underarms 12 hrs. before surgery (men may shave face)  Do not bring valuables to the hospital.  Contacts, dentures or bridgework may not be worn into surgery.  Leave suitcase in the car. After surgery it may be brought to your room.  For patients admitted to the hospital more than one night, checkout time is            11:00 AM                                                       The day of discharge.   Patients discharged the day of surgery will not be allowed to drive home.            If going home same day of surgery, must have someone stay with you              FIRST 24 hrs at home and arrange for some one to drive you              home from hospital.   ________________________________________________________________________  [facility]  Before surgery, you can play an important role.  Because skin is not sterile, your skin needs to be as free of germs as possible.  You can reduce the number of germs on your skin by washing with CHG (chlorahexidine gluconate) soap before surgery.  CHG is  an antiseptic cleaner which kills germs and bonds with the skin to continue killing germs even after washing. Please DO NOT use if you have an allergy to CHG or antibacterial soaps.  If your skin becomes reddened/irritated stop using the CHG and inform your nurse when you arrive at Short Stay. Do not shave (including legs and underarms) for at least 48 hours prior to the first CHG shower.  You may shave your face. Please follow these instructions carefully:   1.  Shower with CHG Soap the night before surgery and the  morning of Surgery.   2.  If you choose to wash your hair, wash your hair first as usual with your  normal  Shampoo.   3.  After you shampoo, rinse your hair and body thoroughly to remove the  shampoo.                                         4.  Use CHG as you would any other liquid soap.  You can apply chg directly  to the skin and wash . Gently wash with scrungie or clean wascloth    5.  Apply the CHG Soap to your body ONLY FROM THE NECK DOWN.   Do not use on open                           Wound or open sores. Avoid contact with eyes, ears mouth and genitals (private parts).                        Genitals (private parts) with your normal soap.              6.  Wash thoroughly, paying special attention to the area where your surgery  will be performed.   7.  Thoroughly rinse your body with warm water from the neck down.   8.  DO NOT shower/wash with your normal soap after using and rinsing off  the CHG Soap .                9.  Pat yourself dry with a clean towel.             10.  Wear clean pajamas.             11.  Place clean sheets on your bed the night of your first shower and do not  sleep with pets.  Day of Surgery : Do not apply any lotions/deodorants the morning of surgery.  Please wear clean clothes to the hospital/surgery center.  FAILURE TO FOLLOW THESE INSTRUCTIONS MAY RESULT IN THE CANCELLATION OF YOUR SURGERY    PATIENT  SIGNATURE_________________________________  ______________________________________________________________________

## 2014-07-12 ENCOUNTER — Other Ambulatory Visit: Payer: Self-pay | Admitting: *Deleted

## 2014-07-12 ENCOUNTER — Other Ambulatory Visit (HOSPITAL_BASED_OUTPATIENT_CLINIC_OR_DEPARTMENT_OTHER): Payer: Medicaid Other

## 2014-07-12 ENCOUNTER — Other Ambulatory Visit: Payer: Medicaid Other

## 2014-07-12 ENCOUNTER — Encounter (HOSPITAL_COMMUNITY)
Admission: RE | Admit: 2014-07-12 | Discharge: 2014-07-12 | Disposition: A | Discharge status: Home / Self Care | Payer: Medicaid Other | Source: Ambulatory Visit | Attending: Surgery | Admitting: Surgery

## 2014-07-12 ENCOUNTER — Ambulatory Visit (HOSPITAL_BASED_OUTPATIENT_CLINIC_OR_DEPARTMENT_OTHER): Payer: Medicaid Other | Admitting: Oncology

## 2014-07-12 ENCOUNTER — Telehealth: Payer: Self-pay | Admitting: Oncology

## 2014-07-12 VITALS — BP 129/77 | HR 72 | Temp 98.2°F | Resp 20 | Ht 67.0 in | Wt 109.6 lb

## 2014-07-12 DIAGNOSIS — R768 Other specified abnormal immunological findings in serum: Secondary | ICD-10-CM

## 2014-07-12 DIAGNOSIS — M329 Systemic lupus erythematosus, unspecified: Secondary | ICD-10-CM | POA: Insufficient documentation

## 2014-07-12 DIAGNOSIS — C50412 Malignant neoplasm of upper-outer quadrant of left female breast: Secondary | ICD-10-CM

## 2014-07-12 DIAGNOSIS — D0501 Lobular carcinoma in situ of right breast: Secondary | ICD-10-CM

## 2014-07-12 DIAGNOSIS — Z170 Estrogen receptor positive status [ER+]: Secondary | ICD-10-CM

## 2014-07-12 LAB — COMPREHENSIVE METABOLIC PANEL (CC13)
ALT: 14 U/L (ref 0–55)
ANION GAP: 10 meq/L (ref 3–11)
AST: 17 U/L (ref 5–34)
Albumin: 3.4 g/dL — ABNORMAL LOW (ref 3.5–5.0)
Alkaline Phosphatase: 72 U/L (ref 40–150)
BUN: 6.6 mg/dL — ABNORMAL LOW (ref 7.0–26.0)
CHLORIDE: 106 meq/L (ref 98–109)
CO2: 24 meq/L (ref 22–29)
Calcium: 8.8 mg/dL (ref 8.4–10.4)
Creatinine: 0.8 mg/dL (ref 0.6–1.1)
Glucose: 106 mg/dl (ref 70–140)
Potassium: 3.9 mEq/L (ref 3.5–5.1)
Sodium: 140 mEq/L (ref 136–145)
TOTAL PROTEIN: 7.4 g/dL (ref 6.4–8.3)
Total Bilirubin: 0.21 mg/dL (ref 0.20–1.20)

## 2014-07-12 LAB — CBC WITH DIFFERENTIAL/PLATELET
BASO%: 0.3 % (ref 0.0–2.0)
Basophils Absolute: 0 10*3/uL (ref 0.0–0.1)
EOS%: 0.4 % (ref 0.0–7.0)
Eosinophils Absolute: 0 10*3/uL (ref 0.0–0.5)
HCT: 33 % — ABNORMAL LOW (ref 34.8–46.6)
HGB: 10.1 g/dL — ABNORMAL LOW (ref 11.6–15.9)
LYMPH#: 0.5 10*3/uL — AB (ref 0.9–3.3)
LYMPH%: 8.6 % — ABNORMAL LOW (ref 14.0–49.7)
MCH: 23.3 pg — ABNORMAL LOW (ref 25.1–34.0)
MCHC: 30.7 g/dL — AB (ref 31.5–36.0)
MCV: 76 fL — ABNORMAL LOW (ref 79.5–101.0)
MONO#: 0.1 10*3/uL (ref 0.1–0.9)
MONO%: 1.4 % (ref 0.0–14.0)
NEUT#: 5.7 10*3/uL (ref 1.5–6.5)
NEUT%: 89.3 % — ABNORMAL HIGH (ref 38.4–76.8)
Platelets: 430 10*3/uL — ABNORMAL HIGH (ref 145–400)
RBC: 4.35 10*6/uL (ref 3.70–5.45)
RDW: 15.4 % — AB (ref 11.2–14.5)
WBC: 6.4 10*3/uL (ref 3.9–10.3)

## 2014-07-12 MED ORDER — PROCHLORPERAZINE MALEATE 10 MG PO TABS: 10.0000 mg | ORAL_TABLET | Freq: Four times a day (QID) | ORAL | Status: DC | PRN

## 2014-07-12 MED ORDER — TOBRAMYCIN-DEXAMETHASONE 0.3-0.1 % OP SUSP: 1.0000 [drp] | Freq: Two times a day (BID) | OPHTHALMIC | Status: DC

## 2014-07-12 MED ORDER — ONDANSETRON HCL 8 MG PO TABS: 8.0000 mg | ORAL_TABLET | Freq: Two times a day (BID) | ORAL | Status: DC

## 2014-07-12 MED ORDER — LORAZEPAM 0.5 MG PO TABS: 0.5000 mg | ORAL_TABLET | Freq: Every evening | ORAL | Status: DC | PRN

## 2014-07-12 MED ORDER — DEXAMETHASONE 4 MG PO TABS: 8.0000 mg | ORAL_TABLET | Freq: Two times a day (BID) | ORAL | Status: DC

## 2014-07-12 MED ORDER — LIDOCAINE-PRILOCAINE 2.5-2.5 % EX CREA: 1.0000 "application " | TOPICAL_CREAM | CUTANEOUS | Status: DC | PRN
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
## 2014-07-12 NOTE — Telephone Encounter (Signed)
[  per pof to sch pt appt-gave pt copy of sch-AM did override for appt

## 2014-07-13 LAB — PROTHROMBIN TIME
INR: 1.04 (ref <1.50)
Prothrombin Time: 13.6 seconds (ref 11.6–15.2)

## 2014-07-13 LAB — APTT: aPTT: 26 seconds (ref 24–37)

## 2014-07-14 ENCOUNTER — Encounter (HOSPITAL_COMMUNITY): Payer: Self-pay | Admitting: *Deleted

## 2014-07-15 ENCOUNTER — Encounter (HOSPITAL_COMMUNITY): Payer: Self-pay | Admitting: Surgery

## 2014-07-15 NOTE — H&P (Signed)
General Surgery [facility]  Breta Demedeiros 07/01/2014 5:01 PM Location: [facility] Patient #: [id number] DOB: 10-05-1965 Married / Language: Cleophus Molt / Race: White Female  History of Present Illness [name] MD; 07/07/2014 12:02 PM) The patient is a 48 year old female presenting for a post-operative visit.  Patient returns for postoperative visit having undergone bilateral mastectomy on 05/18/2014.  Patient plans to begin chemotherapy at the end of October. She is scheduled for infusion port placement next week.   Other Problems Joseph Pierini, LPN; 82/06/9370 69:67 AM) Arthritis Breast Cancer Hemorrhoids Hepatitis  Past Surgical History Joseph Pierini, LPN; 89/11/8099 75:10 AM) Breast Biopsy Left. Breast Mass; Local Excision Left. Knee Surgery Right. Mastectomy Bilateral. Sentinel Lymph Node Biopsy  Diagnostic Studies History Joseph Pierini, LPN; 25/05/5276 82:42 AM) Colonoscopy 5-10 years ago Mammogram within last year Pap Smear 1-5 years ago  Allergies Joseph Pierini, LPN; 35/11/6142 31:54 AM) Aspirin *ANALGESICS - NonNarcotic* Penicillamine *ASSORTED CLASSES* Sulfabenzamide *CHEMICALS*  Medication History Jenny Reichmann Smithey, LPN; 00/05/6760 95:09 AM) Hydroxychloroquine Sulfate (Oral) Specific dose unknown - Active. PredniSONE (Oral) Specific dose unknown - Active.  Social History Jenny Reichmann [place], LPN; 32/03/7123 58:09 AM) Caffeine use Carbonated beverages, Tea. No alcohol use No drug use Tobacco use Never smoker.  Family History Joseph Pierini, LPN; 98/11/3823 05:39 AM) Arthritis Father, Mother. Breast Cancer Mother. Colon Cancer Family Members In General. Diabetes Mellitus Mother. Heart Disease Mother. Heart disease in female family member before age 48  Pregnancy / Birth History Joseph Pierini, LPN; 76/03/3418 37:90 AM) Age at menarche 57 years. Gravida 2 Irregular periods Maternal age  61-35 Para 2  Review of Systems Jenny Reichmann Smithey LPN; 24/0/9735 32:99 AM) General Not Present- Appetite Loss, Chills, Fatigue, Fever, Night Sweats, Weight Gain and Weight Loss. Breast Not Present- Breast Mass, Breast Pain, Nipple Discharge and Skin Changes. Cardiovascular Not Present- Chest Pain, Difficulty Breathing Lying Down, Leg Cramps, Palpitations, Rapid Heart Rate, Shortness of Breath and Swelling of Extremities. Gastrointestinal Not Present- Abdominal Pain, Bloating, Bloody Stool, Change in Bowel Habits, Chronic diarrhea, Constipation, Difficulty Swallowing, Excessive gas, Gets full quickly at meals, Hemorrhoids, Indigestion, Nausea, Rectal Pain and Vomiting. Musculoskeletal Present- Joint Pain and Muscle Pain. Not Present- Back Pain, Joint Stiffness, Muscle Weakness and Swelling of Extremities. Hematology Not Present- Easy Bruising, Excessive bleeding, Gland problems, HIV and Persistent Infections.   Vitals Jenny Reichmann Smithey LPN; 24/11/6832 19:62 AM) 07/07/2014 11:41 AM Weight: 107 lb Height: 67in Body Surface Area: 1.51 m Body Mass Index: 16.76 kg/m Temp.: 98.58F(Oral)  Pulse: 80 (Regular)  Resp.: 18 (Unlabored)  BP: 120/78 (Sitting, Left Arm, Standard)    Physical Exam [name] MD; 07/07/2014 12:03 PM) The physical exam findings are as follows: Note:General - appears comfortable, no distress; not diaphorectic  HEENT - normocephalic; sclerae clear, gaze conjugate; mucous membranes moist, dentition good; voice normal  Neck - symmetric on extension; no palpable anterior or posterior cervical adenopathy; no palpable masses in the thyroid bed  Chest - Well-healed surgical incisions bilaterally; no sign of seroma; no sign of cellulitis; left axillary incision well-healed  Ext - non-tender without significant edema or lymphedema  Neuro - grossly intact; no tremor    Assessment & Plan [name] MD; 07/07/2014 12:05 PM) PRIMARY CANCER OF UPPER OUTER  QUADRANT OF LEFT FEMALE BREAST (174.4  C50.412) Current Plans  Instructed to keep follow-up appointment as scheduled  I reviewed the above findings with the patient and her mother. We discussed placement of her infusion port, its  location, and its expected use. We discussed potential problems. They understand and plan to proceed with the infusion port placement next Friday.  CARE OF INCISION   Apply cocoa butter/vitamin E cream (Palmer's brand) to your incision 2 - 3 times daily.  Massage cream into incision for one minute with each application.  Use sunscreen (50 SPF or higher) for first 6 months after surgery if area is exposed to sun.  You may alternate Mederma or other scar reducing cream with cocoa butter cream if desired.   Signed by [name], MD (07/07/2014 12:06 PM)

## 2014-07-16 ENCOUNTER — Other Ambulatory Visit: Payer: Self-pay

## 2014-07-16 ENCOUNTER — Ambulatory Visit (HOSPITAL_COMMUNITY): Payer: Medicaid Other

## 2014-07-16 ENCOUNTER — Encounter (HOSPITAL_COMMUNITY): Payer: Self-pay | Admitting: *Deleted

## 2014-07-16 ENCOUNTER — Encounter (HOSPITAL_COMMUNITY): Payer: Medicaid Other | Admitting: Certified Registered Nurse Anesthetist

## 2014-07-16 ENCOUNTER — Ambulatory Visit (HOSPITAL_COMMUNITY): Payer: Medicaid Other | Admitting: Certified Registered Nurse Anesthetist

## 2014-07-16 ENCOUNTER — Encounter (HOSPITAL_COMMUNITY)
Admission: RE | Disposition: A | Discharge status: Home / Self Care | Payer: Self-pay | Source: Ambulatory Visit | Attending: Surgery

## 2014-07-16 ENCOUNTER — Ambulatory Visit (HOSPITAL_COMMUNITY)
Admission: RE | Admit: 2014-07-16 | Discharge: 2014-07-16 | Disposition: A | Discharge status: Home / Self Care | Payer: Medicaid Other | Source: Ambulatory Visit | Attending: Surgery | Admitting: Surgery

## 2014-07-16 DIAGNOSIS — C50412 Malignant neoplasm of upper-outer quadrant of left female breast: Secondary | ICD-10-CM | POA: Diagnosis not present

## 2014-07-16 DIAGNOSIS — C50911 Malignant neoplasm of unspecified site of right female breast: Secondary | ICD-10-CM | POA: Insufficient documentation

## 2014-07-16 DIAGNOSIS — R894 Abnormal immunological findings in specimens from other organs, systems and tissues: Secondary | ICD-10-CM | POA: Insufficient documentation

## 2014-07-16 DIAGNOSIS — Z170 Estrogen receptor positive status [ER+]: Secondary | ICD-10-CM

## 2014-07-16 DIAGNOSIS — C50912 Malignant neoplasm of unspecified site of left female breast: Secondary | ICD-10-CM | POA: Diagnosis present

## 2014-07-16 DIAGNOSIS — Z95828 Presence of other vascular implants and grafts: Secondary | ICD-10-CM

## 2014-07-16 DIAGNOSIS — M329 Systemic lupus erythematosus, unspecified: Secondary | ICD-10-CM | POA: Diagnosis not present

## 2014-07-16 HISTORY — PX: PORTACATH PLACEMENT: SHX2246

## 2014-07-16 LAB — PREGNANCY, URINE: PREG TEST UR: NEGATIVE

## 2014-07-16 SURGERY — INSERTION, TUNNELED CENTRAL VENOUS DEVICE, WITH PORT
Anesthesia: General | Site: Chest | Laterality: Left

## 2014-07-16 MED ORDER — LIDOCAINE HCL 1 % IJ SOLN
INTRAMUSCULAR | Status: AC
  Filled 2014-07-16: qty 20

## 2014-07-16 MED ORDER — FENTANYL CITRATE 0.05 MG/ML IJ SOLN
25.0000 ug | INTRAMUSCULAR | Status: DC | PRN
  Administered 2014-07-16 (×2): 50 ug via INTRAVENOUS

## 2014-07-16 MED ORDER — LIDOCAINE HCL (CARDIAC) 20 MG/ML IV SOLN
INTRAVENOUS | Status: AC
  Filled 2014-07-16: qty 5

## 2014-07-16 MED ORDER — LACTATED RINGERS IV SOLN: INTRAVENOUS | Status: DC

## 2014-07-16 MED ORDER — FENTANYL CITRATE 0.05 MG/ML IJ SOLN
INTRAMUSCULAR | Status: AC
  Filled 2014-07-16: qty 2

## 2014-07-16 MED ORDER — SCOPOLAMINE 1 MG/3DAYS TD PT72
MEDICATED_PATCH | TRANSDERMAL | Status: DC | PRN
  Administered 2014-07-16: 1 via TRANSDERMAL

## 2014-07-16 MED ORDER — ONDANSETRON HCL 4 MG/2ML IJ SOLN
INTRAMUSCULAR | Status: AC
  Filled 2014-07-16: qty 2

## 2014-07-16 MED ORDER — HEPARIN SOD (PORK) LOCK FLUSH 100 UNIT/ML IV SOLN
INTRAVENOUS | Status: DC | PRN
  Administered 2014-07-16: 400 [IU] via INTRAVENOUS

## 2014-07-16 MED ORDER — PHENYLEPHRINE HCL 10 MG/ML IJ SOLN
INTRAMUSCULAR | Status: DC | PRN
  Administered 2014-07-16: 60 ug via INTRAVENOUS
  Administered 2014-07-16: 40 ug via INTRAVENOUS
  Administered 2014-07-16 (×2): 80 ug via INTRAVENOUS
  Administered 2014-07-16: 40 ug via INTRAVENOUS

## 2014-07-16 MED ORDER — PROMETHAZINE HCL 25 MG/ML IJ SOLN: 6.2500 mg | INTRAMUSCULAR | Status: DC | PRN

## 2014-07-16 MED ORDER — HEPARIN SOD (PORK) LOCK FLUSH 100 UNIT/ML IV SOLN
INTRAVENOUS | Status: AC
  Filled 2014-07-16: qty 5

## 2014-07-16 MED ORDER — 0.9 % SODIUM CHLORIDE (POUR BTL) OPTIME
TOPICAL | Status: DC | PRN
  Administered 2014-07-16: 1000 mL

## 2014-07-16 MED ORDER — PHENYLEPHRINE 40 MCG/ML (10ML) SYRINGE FOR IV PUSH (FOR BLOOD PRESSURE SUPPORT)
PREFILLED_SYRINGE | INTRAVENOUS | Status: AC
  Filled 2014-07-16: qty 10

## 2014-07-16 MED ORDER — DEXAMETHASONE SODIUM PHOSPHATE 10 MG/ML IJ SOLN
INTRAMUSCULAR | Status: AC
  Filled 2014-07-16: qty 1

## 2014-07-16 MED ORDER — HYDROCODONE-ACETAMINOPHEN 5-325 MG PO TABS: 1.0000 | ORAL_TABLET | ORAL | Status: DC | PRN

## 2014-07-16 MED ORDER — CIPROFLOXACIN IN D5W 400 MG/200ML IV SOLN
INTRAVENOUS | Status: AC
  Filled 2014-07-16: qty 200

## 2014-07-16 MED ORDER — MEPERIDINE HCL 50 MG/ML IJ SOLN: 6.2500 mg | INTRAMUSCULAR | Status: DC | PRN

## 2014-07-16 MED ORDER — HYDROCORTISONE NA SUCCINATE PF 100 MG IJ SOLR
INTRAMUSCULAR | Status: AC
Start: 2014-07-16 — End: 2014-07-16
  Filled 2014-07-16: qty 2

## 2014-07-16 MED ORDER — SODIUM CHLORIDE 0.9 % IR SOLN
Freq: Once | Status: AC
  Administered 2014-07-16: 08:00:00
  Filled 2014-07-16: qty 1.2

## 2014-07-16 MED ORDER — SCOPOLAMINE 1 MG/3DAYS TD PT72
MEDICATED_PATCH | TRANSDERMAL | Status: AC
  Filled 2014-07-16: qty 1

## 2014-07-16 MED ORDER — HYDROCORTISONE NA SUCCINATE PF 100 MG IJ SOLR
INTRAMUSCULAR | Status: DC | PRN
  Administered 2014-07-16: 100 mg via INTRAVENOUS

## 2014-07-16 MED ORDER — MIDAZOLAM HCL 2 MG/2ML IJ SOLN
INTRAMUSCULAR | Status: AC
  Filled 2014-07-16: qty 2

## 2014-07-16 MED ORDER — PROPOFOL 10 MG/ML IV BOLUS
INTRAVENOUS | Status: DC | PRN
  Administered 2014-07-16: 130 mg via INTRAVENOUS

## 2014-07-16 MED ORDER — LIDOCAINE HCL (PF) 1 % IJ SOLN
INTRAMUSCULAR | Status: DC | PRN
  Administered 2014-07-16: 10 mL

## 2014-07-16 MED ORDER — CIPROFLOXACIN IN D5W 400 MG/200ML IV SOLN
400.0000 mg | INTRAVENOUS | Status: AC
  Administered 2014-07-16: 400 mg via INTRAVENOUS

## 2014-07-16 MED ORDER — LIDOCAINE HCL (CARDIAC) 20 MG/ML IV SOLN
INTRAVENOUS | Status: DC | PRN
  Administered 2014-07-16: 60 mg via INTRAVENOUS

## 2014-07-16 MED ORDER — FENTANYL CITRATE 0.05 MG/ML IJ SOLN
INTRAMUSCULAR | Status: DC | PRN
  Administered 2014-07-16 (×2): 25 ug via INTRAVENOUS

## 2014-07-16 MED ORDER — MIDAZOLAM HCL 5 MG/5ML IJ SOLN
INTRAMUSCULAR | Status: DC | PRN
  Administered 2014-07-16: 2 mg via INTRAVENOUS

## 2014-07-16 MED ORDER — PROPOFOL 10 MG/ML IV BOLUS
INTRAVENOUS | Status: AC
  Filled 2014-07-16: qty 20

## 2014-07-16 MED ORDER — LACTATED RINGERS IV SOLN
INTRAVENOUS | Status: DC | PRN
  Administered 2014-07-16 (×2): via INTRAVENOUS

## 2014-07-16 MED ORDER — DEXAMETHASONE SODIUM PHOSPHATE 4 MG/ML IJ SOLN
INTRAMUSCULAR | Status: DC | PRN
  Administered 2014-07-16: 5 mg via INTRAVENOUS

## 2014-07-16 MED ORDER — ONDANSETRON HCL 4 MG/2ML IJ SOLN
INTRAMUSCULAR | Status: DC | PRN
  Administered 2014-07-16: 4 mg via INTRAVENOUS

## 2014-07-16 SURGICAL SUPPLY — 41 items
APL SKNCLS STERI-STRIP NONHPOA (GAUZE/BANDAGES/DRESSINGS) ×1
BAG DECANTER FOR FLEXI CONT (MISCELLANEOUS) ×2 IMPLANT
BENZOIN TINCTURE PRP APPL 2/3 (GAUZE/BANDAGES/DRESSINGS) ×1 IMPLANT
BLADE SURG 15 STRL LF DISP TIS (BLADE) ×1 IMPLANT
BLADE SURG 15 STRL SS (BLADE) ×2
BLADE SURG SZ10 CARB STEEL (BLADE) ×2 IMPLANT
CLOSURE STERI-STRIP 1/4X4 (GAUZE/BANDAGES/DRESSINGS) ×1 IMPLANT
DECANTER SPIKE VIAL GLASS SM (MISCELLANEOUS) ×2 IMPLANT
DRAPE C-ARM 42X120 X-RAY (DRAPES) ×2 IMPLANT
DRAPE LAPAROTOMY TRNSV 102X78 (DRAPE) ×2 IMPLANT
DRSG TEGADERM 4X4.75 (GAUZE/BANDAGES/DRESSINGS) ×1 IMPLANT
ELECT REM PT RETURN 9FT ADLT (ELECTROSURGICAL) ×2
ELECTRODE REM PT RTRN 9FT ADLT (ELECTROSURGICAL) ×1 IMPLANT
GAUZE SPONGE 2X2 8PLY STRL LF (GAUZE/BANDAGES/DRESSINGS) IMPLANT
GAUZE SPONGE 4X4 12PLY STRL (GAUZE/BANDAGES/DRESSINGS) ×4 IMPLANT
GAUZE SPONGE 4X4 16PLY XRAY LF (GAUZE/BANDAGES/DRESSINGS) ×2 IMPLANT
GLOVE BIOGEL PI IND STRL 7.0 (GLOVE) ×1 IMPLANT
GLOVE BIOGEL PI INDICATOR 7.0 (GLOVE) ×1
GLOVE SURG ORTHO 8.0 STRL STRW (GLOVE) ×2 IMPLANT
GOWN STRL REUS W/TWL LRG LVL3 (GOWN DISPOSABLE) ×2 IMPLANT
GOWN STRL REUS W/TWL XL LVL3 (GOWN DISPOSABLE) ×4 IMPLANT
KIT BARDPORT ISP (Port) IMPLANT
KIT BARDPORT ISP 9.6FR (PORTABLE EQUIPMENT SUPPLIES) IMPLANT
KIT BASIN OR (CUSTOM PROCEDURE TRAY) ×2 IMPLANT
KIT PORT POWER ISP 8FR (Catheter) ×1 IMPLANT
MARKER SKIN DUAL TIP RULER LAB (MISCELLANEOUS) ×2 IMPLANT
NDL HYPO 25X1 1.5 SAFETY (NEEDLE) ×1 IMPLANT
NEEDLE HYPO 25X1 1.5 SAFETY (NEEDLE) ×2 IMPLANT
NS IRRIG 1000ML POUR BTL (IV SOLUTION) ×2 IMPLANT
PACK BASIC VI WITH GOWN DISP (CUSTOM PROCEDURE TRAY) ×2 IMPLANT
PENCIL BUTTON HOLSTER BLD 10FT (ELECTRODE) ×2 IMPLANT
SPONGE GAUZE 2X2 STER 10/PKG (GAUZE/BANDAGES/DRESSINGS) ×1
STRIP CLOSURE SKIN 1/4X4 (GAUZE/BANDAGES/DRESSINGS) ×2 IMPLANT
SUT PROLENE 2 0 CT2 30 (SUTURE) ×2 IMPLANT
SUT VIC AB 3-0 SH 27 (SUTURE) ×2
SUT VIC AB 3-0 SH 27XBRD (SUTURE) ×1 IMPLANT
SUT VIC AB 4-0 PS2 27 (SUTURE) ×2 IMPLANT
SYR CONTROL 10ML LL (SYRINGE) ×2 IMPLANT
TAPE CLOTH SURG 4X10 WHT LF (GAUZE/BANDAGES/DRESSINGS) ×1 IMPLANT
TOWEL OR 17X26 10 PK STRL BLUE (TOWEL DISPOSABLE) ×2 IMPLANT
WATER STERILE IRR 1500ML POUR (IV SOLUTION) ×2 IMPLANT

## 2014-07-16 NOTE — Anesthesia Preprocedure Evaluation (Addendum)
Anesthesia Evaluation  Patient identified by MRN, date of birth, ID band Patient awake    Reviewed: Allergy & Precautions, H&P , NPO status , Patient's Chart, lab work & pertinent test results, reviewed documented beta blocker date and time   History of Anesthesia Complications (+) PONV  Airway Mallampati: II TM Distance: >3 FB Neck ROM: Full    Dental no notable dental hx. (+) Teeth Intact   Pulmonary neg pulmonary ROS,  breath sounds clear to auscultation  Pulmonary exam normal       Cardiovascular negative cardio ROS  Rhythm:Regular Rate:Normal     Neuro/Psych negative neurological ROS  negative psych ROS   GI/Hepatic negative GI ROS, (+) Hepatitis -, C  Endo/Other  negative endocrine ROS  Renal/GU negative Renal ROS  negative genitourinary   Musculoskeletal  (+) Arthritis - ( Lupus), Rheumatoid disorders,    Abdominal (+)  Abdomen: soft.    Peds negative pediatric ROS (+)  Hematology negative hematology ROS (+)   Anesthesia Other Findings LUPUS on steroids  Reproductive/Obstetrics negative OB ROS                          Anesthesia Physical  Anesthesia Plan  ASA: III  Anesthesia Plan: General   Post-op Pain Management:    Induction: Intravenous  Airway Management Planned: LMA  Additional Equipment:   Intra-op Plan:   Post-operative Plan:   Informed Consent: I have reviewed the patients History and Physical, chart, labs and discussed the procedure including the risks, benefits and alternatives for the proposed anesthesia with the patient or authorized representative who has indicated his/her understanding and acceptance.     Plan Discussed with:   Anesthesia Plan Comments: (Needs steroids intra op)       Anesthesia Quick Evaluation

## 2014-07-16 NOTE — Progress Notes (Signed)
X-ray results noted 

## 2014-07-16 NOTE — Op Note (Signed)
NAMEEVANNA, WASHINTON NO.:  [id number]  MEDICAL RECORD NO.:  [id number]  LOCATION:  [facility]                         FACILITY:  [facility]  PHYSICIAN:  [name], MD      DATE OF BIRTH:  Apr 06, 1966  DATE OF PROCEDURE:  07/16/2014                               OPERATIVE REPORT   PREOPERATIVE DIAGNOSIS:  Carcinoma of the breast.  POSTOPERATIVE DIAGNOSIS:  Carcinoma of the breast.  PROCEDURE:  Placement of infusion port in left subclavian vein.  SURGEON:  [name], MD, FACS  ANESTHESIA:  General.  ESTIMATED BLOOD LOSS:  Minimal.  PREPARATION:  ChloraPrep.  COMPLICATIONS:  None.  INDICATIONS:  The patient is a 48 year old female who underwent bilateral mastectomy for management of carcinoma of the breast.  She anticipates a course of chemotherapy and a venous access device is requested by her medical oncologist.  The patient now comes to Surgery for placement of infusion port.  DESCRIPTION OF PROCEDURE:  Procedure was done in OR #6 at the [facility].  The patient was brought to the operating room, placed in a supine position on the operating room table.  Following administration of general anesthesia, the patient was positioned, and then prepped and draped in usual aseptic fashion.  After ascertaining that an adequate level of anesthesia had been achieved, the skin beneath the left clavicle was anesthetized with local anesthetic.  The patient was placed in Trendelenburg.  Using an 18-gauge Seldinger needle, the left subclavian vein was accessed in a single pass.  A soft J-shaped guidewire was inserted and the needle was removed.  Site of port placement on the chest wall was anesthetized with local anesthetic.  A 2-cm incision was made with a #15 blade.  Dissection was carried in subcutaneous tissues and a subcutaneous pocket was created to accommodate the Bard PowerPort.  The port was assembled and flushed with heparinized saline.   The port was placed into the subcutaneous pocket and the catheter tunneled subcutaneously to the site of guidewire insertion.  Catheter was measured and cut at approximately 22 cm of length.  Port was flushed with heparinized saline.  Using a dilator and peel-away sleeve, the port catheter was introduced into the left subclavian vein.  At this point, the dilator, peel-away sleeve, and guidewire were all removed.  C-arm fluoroscopy was used to confirm placement of the catheter in the superior vena cava.  Port aspirated blood easily and flushed easily with heparinized saline.  Port was flushed with 4 mL of 100 unit/mL heparin solution.  Subcutaneous tissues were closed with interrupted 3-0 Vicryl sutures. Skin was closed with a running 4-0 Vicryl subcuticular suture.  Wounds were washed and dried, and benzoin and Steri-Strips were applied. Sterile dressings were applied.  The patient was awakened from anesthesia and transported to the recovery room.  A portable chest x-ray will be obtained in the recovery room to confirm catheter placement. The patient tolerated the procedure well.   [name], MD, [facility] Office: [phone]    TMG/MEDQ  D:  07/16/2014  T:  07/16/2014  Job:  [id number]  cc:   Chauncey Cruel, M.D. Fax:  [phone] 

## 2014-07-16 NOTE — Brief Op Note (Signed)
07/16/2014  8:18 AM  PATIENT:  Kendra Sullivan  48 y.o. female  PRE-OPERATIVE DIAGNOSIS:  breast cancer  POST-OPERATIVE DIAGNOSIS:  breast cancer  PROCEDURE:  Procedure(s): INSERTION PORT-A-CATH (Left)  SURGEON:  Surgeon(s) and Role:    * [name], MD - Primary  ANESTHESIA:   general  EBL:     BLOOD ADMINISTERED:none  DRAINS: 8 Fr Power Port single lumen   LOCAL MEDICATIONS USED:  LIDOCAINE   SPECIMEN:  No Specimen  DISPOSITION OF SPECIMEN:  N/A  COUNTS:  YES  TOURNIQUET:  * No tourniquets in log *  DICTATION: .Other Dictation: Dictation Number [id number]  PLAN OF CARE: Discharge to home after PACU  PATIENT DISPOSITION:  PACU - hemodynamically stable.   Delay start of Pharmacological VTE agent (>24hrs) due to surgical blood loss or risk of bleeding: yes  [name], MD, [facility] Office: [phone]

## 2014-07-16 NOTE — Progress Notes (Signed)
Portable upright chest x-ray done. 

## 2014-07-16 NOTE — Transfer of Care (Signed)
Immediate Anesthesia Transfer of Care Note  Patient: Kendra Sullivan  Procedure(s) Performed: Procedure(s) (LRB): INSERTION PORT-A-CATH (Left)  Patient Location: PACU  Anesthesia Type: General  Level of Consciousness: sedated, patient cooperative and responds to stimulation  Airway & Oxygen Therapy: Patient Spontanous Breathing and Patient connected to face mask oxgen  Post-op Assessment: Report given to PACU RN and Post -op Vital signs reviewed and stable  Post vital signs: Reviewed and stable  Complications: No apparent anesthesia complications

## 2014-07-16 NOTE — Interval H&P Note (Signed)
History and Physical Interval Note:  07/16/2014 7:18 AM  Kendra Sullivan  has presented today for surgery, with the diagnosis of breast cancer.  The various methods of treatment have been discussed with the patient and family. After consideration of risks, benefits and other options for treatment, the patient has consented to    Procedure(s): INSERTION PORT-A-CATH (N/A) as a surgical intervention .    The patient's history has been reviewed, patient examined, no change in status, stable for surgery.  I have reviewed the patient's chart and labs.  Questions were answered to the patient's satisfaction.    [name], MD, [facility] Office: [place]

## 2014-07-16 NOTE — Anesthesia Postprocedure Evaluation (Signed)
  Anesthesia Post-op Note  Patient: Kendra Sullivan  Procedure(s) Performed: Procedure(s) (LRB): INSERTION PORT-A-CATH (Left)  Patient Location: PACU  Anesthesia Type: general   Level of Consciousness: awake and alert   Airway and Oxygen Therapy: Patient Spontanous Breathing  Post-op Pain: mild  Post-op Assessment: Post-op Vital signs reviewed, Patient's Cardiovascular Status Stable, Respiratory Function Stable, Patent Airway and No signs of Nausea or vomiting  Last Vitals:  Filed Vitals:   07/16/14 1020  BP: 103/64  Pulse: 73  Temp:   Resp: 16    Post-op Vital Signs: stable   Complications: No apparent anesthesia complications

## 2014-07-19 ENCOUNTER — Encounter (HOSPITAL_COMMUNITY): Payer: Self-pay | Admitting: Surgery

## 2014-07-29 ENCOUNTER — Other Ambulatory Visit: Payer: Self-pay | Admitting: Emergency Medicine

## 2014-07-29 DIAGNOSIS — C50412 Malignant neoplasm of upper-outer quadrant of left female breast: Secondary | ICD-10-CM

## 2014-07-30 ENCOUNTER — Other Ambulatory Visit (HOSPITAL_BASED_OUTPATIENT_CLINIC_OR_DEPARTMENT_OTHER): Payer: Medicaid Other

## 2014-07-30 ENCOUNTER — Other Ambulatory Visit: Payer: Self-pay | Admitting: Hematology and Oncology

## 2014-07-30 ENCOUNTER — Ambulatory Visit (HOSPITAL_BASED_OUTPATIENT_CLINIC_OR_DEPARTMENT_OTHER): Payer: Medicaid Other

## 2014-07-30 VITALS — BP 119/77 | HR 93 | Temp 99.5°F | Resp 18

## 2014-07-30 DIAGNOSIS — Z5111 Encounter for antineoplastic chemotherapy: Secondary | ICD-10-CM

## 2014-07-30 DIAGNOSIS — C50412 Malignant neoplasm of upper-outer quadrant of left female breast: Secondary | ICD-10-CM

## 2014-07-30 LAB — CBC WITH DIFFERENTIAL/PLATELET
BASO%: 0.3 % (ref 0.0–2.0)
Basophils Absolute: 0 10*3/uL (ref 0.0–0.1)
EOS%: 0.3 % (ref 0.0–7.0)
Eosinophils Absolute: 0 10*3/uL (ref 0.0–0.5)
HCT: 32.7 % — ABNORMAL LOW (ref 34.8–46.6)
HGB: 10 g/dL — ABNORMAL LOW (ref 11.6–15.9)
LYMPH%: 14.4 % (ref 14.0–49.7)
MCH: 22.6 pg — ABNORMAL LOW (ref 25.1–34.0)
MCHC: 30.5 g/dL — AB (ref 31.5–36.0)
MCV: 74.1 fL — AB (ref 79.5–101.0)
MONO#: 0.6 10*3/uL (ref 0.1–0.9)
MONO%: 6.8 % (ref 0.0–14.0)
NEUT#: 7 10*3/uL — ABNORMAL HIGH (ref 1.5–6.5)
NEUT%: 78.2 % — ABNORMAL HIGH (ref 38.4–76.8)
PLATELETS: 401 10*3/uL — AB (ref 145–400)
RBC: 4.42 10*6/uL (ref 3.70–5.45)
RDW: 15.6 % — ABNORMAL HIGH (ref 11.2–14.5)
WBC: 8.9 10*3/uL (ref 3.9–10.3)
lymph#: 1.3 10*3/uL (ref 0.9–3.3)

## 2014-07-30 LAB — COMPREHENSIVE METABOLIC PANEL (CC13)
ALK PHOS: 67 U/L (ref 40–150)
ALT: 13 U/L (ref 0–55)
AST: 12 U/L (ref 5–34)
Albumin: 3.5 g/dL (ref 3.5–5.0)
Anion Gap: 11 mEq/L (ref 3–11)
BILIRUBIN TOTAL: 0.27 mg/dL (ref 0.20–1.20)
BUN: 11.3 mg/dL (ref 7.0–26.0)
CO2: 22 mEq/L (ref 22–29)
Calcium: 9.3 mg/dL (ref 8.4–10.4)
Chloride: 108 mEq/L (ref 98–109)
Creatinine: 0.8 mg/dL (ref 0.6–1.1)
Glucose: 111 mg/dl (ref 70–140)
Potassium: 3.6 mEq/L (ref 3.5–5.1)
SODIUM: 141 meq/L (ref 136–145)
TOTAL PROTEIN: 7.4 g/dL (ref 6.4–8.3)

## 2014-07-30 MED ORDER — HEPARIN SOD (PORK) LOCK FLUSH 100 UNIT/ML IV SOLN
500.0000 [IU] | Freq: Once | INTRAVENOUS | Status: DC | PRN
  Filled 2014-07-30: qty 5

## 2014-07-30 MED ORDER — SODIUM CHLORIDE 0.9 % IV SOLN
600.0000 mg/m2 | Freq: Once | INTRAVENOUS | Status: AC
  Administered 2014-07-30: 920 mg via INTRAVENOUS
  Filled 2014-07-30: qty 46

## 2014-07-30 MED ORDER — DEXAMETHASONE SODIUM PHOSPHATE 20 MG/5ML IJ SOLN
20.0000 mg | Freq: Once | INTRAMUSCULAR | Status: AC
  Administered 2014-07-30: 20 mg via INTRAVENOUS

## 2014-07-30 MED ORDER — SODIUM CHLORIDE 0.9 % IV SOLN
Freq: Once | INTRAVENOUS | Status: AC
  Administered 2014-07-30: 12:00:00 via INTRAVENOUS

## 2014-07-30 MED ORDER — LIDOCAINE-PRILOCAINE 2.5-2.5 % EX CREA: 1.0000 "application " | TOPICAL_CREAM | CUTANEOUS | Status: DC | PRN

## 2014-07-30 MED ORDER — ONDANSETRON 16 MG/50ML IVPB (CHCC)
INTRAVENOUS | Status: AC
  Filled 2014-07-30: qty 16

## 2014-07-30 MED ORDER — ONDANSETRON 16 MG/50ML IVPB (CHCC)
16.0000 mg | Freq: Once | INTRAVENOUS | Status: AC
Start: 2014-07-30 — End: 2014-07-30
  Administered 2014-07-30: 16 mg via INTRAVENOUS

## 2014-07-30 MED ORDER — DOCETAXEL CHEMO INJECTION 160 MG/16ML
75.0000 mg/m2 | Freq: Once | INTRAVENOUS | Status: AC
  Administered 2014-07-30: 120 mg via INTRAVENOUS
  Filled 2014-07-30: qty 12

## 2014-07-30 MED ORDER — DEXAMETHASONE SODIUM PHOSPHATE 20 MG/5ML IJ SOLN
INTRAMUSCULAR | Status: AC
  Filled 2014-07-30: qty 5

## 2014-07-30 MED ORDER — TOBRAMYCIN-DEXAMETHASONE 0.3-0.1 % OP SUSP: 1.0000 [drp] | Freq: Two times a day (BID) | OPHTHALMIC | Status: DC

## 2014-07-30 MED ORDER — SODIUM CHLORIDE 0.9 % IJ SOLN
10.0000 mL | INTRAMUSCULAR | Status: DC | PRN
  Filled 2014-07-30: qty 10

## 2014-07-30 NOTE — Patient Instructions (Signed)
Kendra Sullivan Discharge Instructions for Patients Receiving Chemotherapy  Today you received the following chemotherapy agents Taxotere, Cytoxan  To help prevent nausea and vomiting after your treatment, we encourage you to take your nausea medication as directed. If you develop nausea and vomiting that is not controlled by your nausea medication, call the clinic.   BELOW ARE SYMPTOMS THAT SHOULD BE REPORTED IMMEDIATELY:  *FEVER GREATER THAN 100.5 F  *CHILLS WITH OR WITHOUT FEVER  NAUSEA AND VOMITING THAT IS NOT CONTROLLED WITH YOUR NAUSEA MEDICATION  *UNUSUAL SHORTNESS OF BREATH  *UNUSUAL BRUISING OR BLEEDING  TENDERNESS IN MOUTH AND THROAT WITH OR WITHOUT PRESENCE OF ULCERS  *URINARY PROBLEMS  *BOWEL PROBLEMS  UNUSUAL RASH Items with * indicate a potential emergency and should be followed up as soon as possible.  Feel free to call the clinic you have any questions or concerns. The clinic phone number is [phone].

## 2014-07-31 ENCOUNTER — Ambulatory Visit (HOSPITAL_BASED_OUTPATIENT_CLINIC_OR_DEPARTMENT_OTHER): Payer: Medicaid Other

## 2014-07-31 VITALS — BP 112/77 | HR 83 | Temp 98.9°F | Resp 18

## 2014-07-31 DIAGNOSIS — C50412 Malignant neoplasm of upper-outer quadrant of left female breast: Secondary | ICD-10-CM

## 2014-07-31 MED ORDER — PEGFILGRASTIM INJECTION 6 MG/0.6ML
6.0000 mg | Freq: Once | SUBCUTANEOUS | Status: AC
  Administered 2014-07-31: 6 mg via SUBCUTANEOUS

## 2014-07-31 NOTE — Patient Instructions (Signed)
Pegfilgrastim injection What is this medicine? PEGFILGRASTIM (peg fil GRA stim) is a long-acting granulocyte colony-stimulating factor that stimulates the growth of neutrophils, a type of white blood cell important in the body's fight against infection. It is used to reduce the incidence of fever and infection in patients with certain types of cancer who are receiving chemotherapy that affects the bone marrow. This medicine may be used for other purposes; ask your health care provider or pharmacist if you have questions. COMMON BRAND NAME(S): Neulasta What should I tell my health care provider before I take this medicine? They need to know if you have any of these conditions: -latex allergy -ongoing radiation therapy -sickle cell disease -skin reactions to acrylic adhesives (On-Body Injector only) -an unusual or allergic reaction to pegfilgrastim, filgrastim, other medicines, foods, dyes, or preservatives -pregnant or trying to get pregnant -breast-feeding How should I use this medicine? This medicine is for injection under the skin. If you get this medicine at home, you will be taught how to prepare and give the pre-filled syringe or how to use the On-body Injector. Refer to the patient Instructions for Use for detailed instructions. Use exactly as directed. Take your medicine at regular intervals. Do not take your medicine more often than directed. It is important that you put your used needles and syringes in a special sharps container. Do not put them in a trash can. If you do not have a sharps container, call your pharmacist or healthcare provider to get one. Talk to your pediatrician regarding the use of this medicine in children. Special care may be needed. Overdosage: If you think you have taken too much of this medicine contact a poison control center or emergency room at once. NOTE: This medicine is only for you. Do not share this medicine with others. What if I miss a dose? It is  important not to miss your dose. Call your doctor or health care professional if you miss your dose. If you miss a dose due to an On-body Injector failure or leakage, a new dose should be administered as soon as possible using a single prefilled syringe for manual use. What may interact with this medicine? Interactions have not been studied. Give your health care provider a list of all the medicines, herbs, non-prescription drugs, or dietary supplements you use. Also tell them if you smoke, drink alcohol, or use illegal drugs. Some items may interact with your medicine. This list may not describe all possible interactions. Give your health care provider a list of all the medicines, herbs, non-prescription drugs, or dietary supplements you use. Also tell them if you smoke, drink alcohol, or use illegal drugs. Some items may interact with your medicine. What should I watch for while using this medicine? You may need blood work done while you are taking this medicine. If you are going to need a MRI, CT scan, or other procedure, tell your doctor that you are using this medicine (On-Body Injector only). What side effects may I notice from receiving this medicine? Side effects that you should report to your doctor or health care professional as soon as possible: -allergic reactions like skin rash, itching or hives, swelling of the face, lips, or tongue -dizziness -fever -pain, redness, or irritation at site where injected -pinpoint red spots on the skin -shortness of breath or breathing problems -stomach or side pain, or pain at the shoulder -swelling -tiredness -trouble passing urine Side effects that usually do not require medical attention (report to your doctor   or health care professional if they continue or are bothersome): -bone pain -muscle pain This list may not describe all possible side effects. Call your doctor for medical advice about side effects. You may report side effects to FDA at  1-800-FDA-1088. Where should I keep my medicine? Keep out of the reach of children. Store pre-filled syringes in a refrigerator between 2 and 8 degrees C (36 and 46 degrees F). Do not freeze. Keep in carton to protect from light. Throw away this medicine if it is left out of the refrigerator for more than 48 hours. Throw away any unused medicine after the expiration date. NOTE: This sheet is a summary. It may not cover all possible information. If you have questions about this medicine, talk to your doctor, pharmacist, or health care provider.  2015, [employer]. (2013-12-17 16:14:05)  

## 2014-08-05 ENCOUNTER — Other Ambulatory Visit: Payer: Self-pay | Admitting: *Deleted

## 2014-08-05 DIAGNOSIS — C50412 Malignant neoplasm of upper-outer quadrant of left female breast: Secondary | ICD-10-CM

## 2014-08-06 ENCOUNTER — Other Ambulatory Visit (HOSPITAL_BASED_OUTPATIENT_CLINIC_OR_DEPARTMENT_OTHER): Payer: Medicaid Other

## 2014-08-06 ENCOUNTER — Ambulatory Visit (HOSPITAL_BASED_OUTPATIENT_CLINIC_OR_DEPARTMENT_OTHER): Payer: Medicaid Other | Admitting: Oncology

## 2014-08-06 VITALS — BP 112/64 | HR 98 | Temp 98.8°F | Resp 18 | Ht 67.0 in | Wt 113.4 lb

## 2014-08-06 DIAGNOSIS — D0501 Lobular carcinoma in situ of right breast: Secondary | ICD-10-CM

## 2014-08-06 DIAGNOSIS — R7689 Other specified abnormal immunological findings in serum: Secondary | ICD-10-CM

## 2014-08-06 DIAGNOSIS — R768 Other specified abnormal immunological findings in serum: Secondary | ICD-10-CM

## 2014-08-06 DIAGNOSIS — C50412 Malignant neoplasm of upper-outer quadrant of left female breast: Secondary | ICD-10-CM

## 2014-08-06 DIAGNOSIS — Z170 Estrogen receptor positive status [ER+]: Secondary | ICD-10-CM

## 2014-08-06 LAB — COMPREHENSIVE METABOLIC PANEL (CC13)
ALK PHOS: 85 U/L (ref 40–150)
ALT: 17 U/L (ref 0–55)
AST: 19 U/L (ref 5–34)
Albumin: 3.1 g/dL — ABNORMAL LOW (ref 3.5–5.0)
Anion Gap: 6 mEq/L (ref 3–11)
BUN: 10.3 mg/dL (ref 7.0–26.0)
CO2: 28 mEq/L (ref 22–29)
CREATININE: 0.9 mg/dL (ref 0.6–1.1)
Calcium: 8.8 mg/dL (ref 8.4–10.4)
Chloride: 104 mEq/L (ref 98–109)
Glucose: 108 mg/dl (ref 70–140)
Potassium: 4.2 mEq/L (ref 3.5–5.1)
Sodium: 139 mEq/L (ref 136–145)
Total Protein: 6.3 g/dL — ABNORMAL LOW (ref 6.4–8.3)

## 2014-08-06 LAB — CBC WITH DIFFERENTIAL/PLATELET
HCT: 32.1 % — ABNORMAL LOW (ref 34.8–46.6)
HGB: 9.7 g/dL — ABNORMAL LOW (ref 11.6–15.9)
MCH: 23.2 pg — AB (ref 25.1–34.0)
MCHC: 30.2 g/dL — AB (ref 31.5–36.0)
MCV: 76.8 fL — AB (ref 79.5–101.0)
PLATELETS: 249 10*3/uL (ref 145–400)
RBC: 4.18 10*6/uL (ref 3.70–5.45)
RDW: 15.4 % — ABNORMAL HIGH (ref 11.2–14.5)
WBC: 28.3 10*3/uL — ABNORMAL HIGH (ref 3.9–10.3)

## 2014-08-06 LAB — MANUAL DIFFERENTIAL
ALC: 3.4 10*3/uL — AB (ref 0.9–3.3)
ANC (CHCC manual diff): 23 10*3/uL — ABNORMAL HIGH (ref 1.5–6.5)
BAND NEUTROPHILS: 33 % — AB (ref 0–10)
Basophil: 0 % (ref 0–2)
Blasts: 0 % (ref 0–0)
EOS: 0 % (ref 0–7)
LYMPH: 12 % — ABNORMAL LOW (ref 14–49)
MONO: 7 % (ref 0–14)
MYELOCYTES: 10 % — AB (ref 0–0)
Metamyelocytes: 11 % — ABNORMAL HIGH (ref 0–0)
OTHER CELL: 0 % (ref 0–0)
PLT EST: ADEQUATE
PROMYELO: 0 % (ref 0–0)
SEG: 27 % — AB (ref 38–77)
VARIANT LYMPH: 0 % (ref 0–0)
nRBC: 0 % (ref 0–0)

## 2014-08-06 MED ORDER — HYDROCODONE-ACETAMINOPHEN 5-325 MG PO TABS: 1.0000 | ORAL_TABLET | ORAL | Status: DC | PRN

## 2014-08-06 MED ORDER — FLUCONAZOLE 100 MG PO TABS: 100.0000 mg | ORAL_TABLET | Freq: Every day | ORAL | Status: DC

## 2014-08-06 MED ORDER — OMEPRAZOLE 40 MG PO CPDR: 40.0000 mg | DELAYED_RELEASE_CAPSULE | Freq: Every day | ORAL | Status: DC
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
## 2014-08-09 ENCOUNTER — Telehealth: Payer: Self-pay | Admitting: *Deleted

## 2014-08-09 NOTE — Telephone Encounter (Signed)
Per staff message and POF I have scheduled appts. Advised scheduler of appts. JMW  

## 2014-08-11 ENCOUNTER — Telehealth: Payer: Self-pay | Admitting: Oncology

## 2014-08-11 NOTE — Telephone Encounter (Signed)
, °

## 2014-08-20 ENCOUNTER — Ambulatory Visit (HOSPITAL_BASED_OUTPATIENT_CLINIC_OR_DEPARTMENT_OTHER): Payer: Medicaid Other | Admitting: Nurse Practitioner

## 2014-08-20 ENCOUNTER — Other Ambulatory Visit (HOSPITAL_BASED_OUTPATIENT_CLINIC_OR_DEPARTMENT_OTHER): Payer: Medicaid Other

## 2014-08-20 ENCOUNTER — Telehealth: Payer: Self-pay | Admitting: Adult Health

## 2014-08-20 ENCOUNTER — Encounter: Payer: Self-pay | Admitting: Nurse Practitioner

## 2014-08-20 ENCOUNTER — Other Ambulatory Visit: Payer: Medicaid Other

## 2014-08-20 ENCOUNTER — Ambulatory Visit (HOSPITAL_BASED_OUTPATIENT_CLINIC_OR_DEPARTMENT_OTHER): Payer: Medicaid Other

## 2014-08-20 ENCOUNTER — Other Ambulatory Visit: Payer: Self-pay | Admitting: Oncology

## 2014-08-20 VITALS — BP 122/67 | HR 89 | Temp 98.3°F | Resp 18 | Ht 67.0 in | Wt 115.8 lb

## 2014-08-20 DIAGNOSIS — Z5111 Encounter for antineoplastic chemotherapy: Secondary | ICD-10-CM

## 2014-08-20 DIAGNOSIS — C773 Secondary and unspecified malignant neoplasm of axilla and upper limb lymph nodes: Secondary | ICD-10-CM

## 2014-08-20 DIAGNOSIS — C50412 Malignant neoplasm of upper-outer quadrant of left female breast: Secondary | ICD-10-CM

## 2014-08-20 DIAGNOSIS — D0501 Lobular carcinoma in situ of right breast: Secondary | ICD-10-CM

## 2014-08-20 DIAGNOSIS — Z170 Estrogen receptor positive status [ER+]: Secondary | ICD-10-CM

## 2014-08-20 LAB — COMPREHENSIVE METABOLIC PANEL (CC13)
ALT: 15 U/L (ref 0–55)
AST: 14 U/L (ref 5–34)
Albumin: 3.4 g/dL — ABNORMAL LOW (ref 3.5–5.0)
Alkaline Phosphatase: 71 U/L (ref 40–150)
Anion Gap: 9 mEq/L (ref 3–11)
BUN: 15.8 mg/dL (ref 7.0–26.0)
CO2: 26 mEq/L (ref 22–29)
Calcium: 8.9 mg/dL (ref 8.4–10.4)
Chloride: 107 mEq/L (ref 98–109)
Creatinine: 0.9 mg/dL (ref 0.6–1.1)
Glucose: 80 mg/dl (ref 70–140)
POTASSIUM: 3.6 meq/L (ref 3.5–5.1)
Sodium: 142 mEq/L (ref 136–145)
Total Bilirubin: 0.22 mg/dL (ref 0.20–1.20)
Total Protein: 6.8 g/dL (ref 6.4–8.3)

## 2014-08-20 LAB — CBC WITH DIFFERENTIAL/PLATELET
BASO%: 0.3 % (ref 0.0–2.0)
Basophils Absolute: 0 10*3/uL (ref 0.0–0.1)
EOS%: 1.2 % (ref 0.0–7.0)
Eosinophils Absolute: 0.1 10*3/uL (ref 0.0–0.5)
HCT: 31 % — ABNORMAL LOW (ref 34.8–46.6)
HGB: 9.2 g/dL — ABNORMAL LOW (ref 11.6–15.9)
LYMPH%: 25.6 % (ref 14.0–49.7)
MCH: 22.9 pg — AB (ref 25.1–34.0)
MCHC: 29.7 g/dL — AB (ref 31.5–36.0)
MCV: 77.1 fL — ABNORMAL LOW (ref 79.5–101.0)
MONO#: 0.8 10*3/uL (ref 0.1–0.9)
MONO%: 11.6 % (ref 0.0–14.0)
NEUT#: 4.2 10*3/uL (ref 1.5–6.5)
NEUT%: 61.3 % (ref 38.4–76.8)
Platelets: 317 10*3/uL (ref 145–400)
RBC: 4.02 10*6/uL (ref 3.70–5.45)
RDW: 16.5 % — AB (ref 11.2–14.5)
WBC: 6.8 10*3/uL (ref 3.9–10.3)
lymph#: 1.7 10*3/uL (ref 0.9–3.3)

## 2014-08-20 LAB — URINALYSIS, MICROSCOPIC - CHCC
BILIRUBIN (URINE): NEGATIVE
Blood: NEGATIVE
GLUCOSE UR CHCC: NEGATIVE mg/dL
KETONES: NEGATIVE mg/dL
LEUKOCYTE ESTERASE: NEGATIVE
Nitrite: NEGATIVE
Protein: NEGATIVE mg/dL
RBC / HPF: NEGATIVE (ref 0–2)
Specific Gravity, Urine: 1.01 (ref 1.003–1.035)
Urobilinogen, UR: 0.2 mg/dL (ref 0.2–1)
pH: 6 (ref 4.6–8.0)

## 2014-08-20 MED ORDER — SODIUM CHLORIDE 0.9 % IJ SOLN
10.0000 mL | INTRAMUSCULAR | Status: DC | PRN
  Administered 2014-08-20: 10 mL
  Filled 2014-08-20: qty 10

## 2014-08-20 MED ORDER — DEXAMETHASONE SODIUM PHOSPHATE 20 MG/5ML IJ SOLN
INTRAMUSCULAR | Status: AC
  Filled 2014-08-20: qty 5

## 2014-08-20 MED ORDER — DEXAMETHASONE SODIUM PHOSPHATE 20 MG/5ML IJ SOLN
20.0000 mg | Freq: Once | INTRAMUSCULAR | Status: AC
  Administered 2014-08-20: 20 mg via INTRAVENOUS

## 2014-08-20 MED ORDER — SODIUM CHLORIDE 0.9 % IV SOLN
600.0000 mg/m2 | Freq: Once | INTRAVENOUS | Status: AC
  Administered 2014-08-20: 920 mg via INTRAVENOUS
  Filled 2014-08-20: qty 46

## 2014-08-20 MED ORDER — ONDANSETRON 16 MG/50ML IVPB (CHCC)
INTRAVENOUS | Status: AC
  Filled 2014-08-20: qty 16

## 2014-08-20 MED ORDER — DOCETAXEL CHEMO INJECTION 160 MG/16ML
75.0000 mg/m2 | Freq: Once | INTRAVENOUS | Status: AC
  Administered 2014-08-20: 120 mg via INTRAVENOUS
  Filled 2014-08-20: qty 12

## 2014-08-20 MED ORDER — ONDANSETRON 16 MG/50ML IVPB (CHCC)
16.0000 mg | Freq: Once | INTRAVENOUS | Status: AC
  Administered 2014-08-20: 16 mg via INTRAVENOUS

## 2014-08-20 MED ORDER — HEPARIN SOD (PORK) LOCK FLUSH 100 UNIT/ML IV SOLN
500.0000 [IU] | Freq: Once | INTRAVENOUS | Status: AC | PRN
  Administered 2014-08-20: 500 [IU]
  Filled 2014-08-20: qty 5

## 2014-08-20 MED ORDER — SODIUM CHLORIDE 0.9 % IV SOLN
Freq: Once | INTRAVENOUS | Status: AC
  Administered 2014-08-20: 11:00:00 via INTRAVENOUS
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
## 2014-08-20 NOTE — Telephone Encounter (Signed)
Pt confirmed labs/ov per 11/20 POF, pt req to change to 11/27 w/HF per HF but there was no availability so I put her with NP/LC ok per pt, gave pt updated sch..... KJ

## 2014-08-20 NOTE — Patient Instructions (Signed)
[facility] Discharge Instructions for Patients Receiving Chemotherapy  Today you received the following chemotherapy agents: Taxotere, Cytoxan.  To help prevent nausea and vomiting after your treatment, we encourage you to take your nausea medication: compazine 10 mg every 6 houtrs, Zofran 8 mg every 12 hours as needed.   If you develop nausea and vomiting that is not controlled by your nausea medication, call the clinic.   BELOW ARE SYMPTOMS THAT SHOULD BE REPORTED IMMEDIATELY:  *FEVER GREATER THAN 100.5 F  *CHILLS WITH OR WITHOUT FEVER  NAUSEA AND VOMITING THAT IS NOT CONTROLLED WITH YOUR NAUSEA MEDICATION  *UNUSUAL SHORTNESS OF BREATH  *UNUSUAL BRUISING OR BLEEDING  TENDERNESS IN MOUTH AND THROAT WITH OR WITHOUT PRESENCE OF ULCERS  *URINARY PROBLEMS  *BOWEL PROBLEMS  UNUSUAL RASH Items with * indicate a potential emergency and should be followed up as soon as possible.  Feel free to call the clinic you have any questions or concerns. The clinic phone number is [phone].

## 2014-08-21 ENCOUNTER — Ambulatory Visit (HOSPITAL_BASED_OUTPATIENT_CLINIC_OR_DEPARTMENT_OTHER): Payer: Medicaid Other

## 2014-08-21 DIAGNOSIS — M329 Systemic lupus erythematosus, unspecified: Secondary | ICD-10-CM

## 2014-08-21 DIAGNOSIS — Z800 Family history of malignant neoplasm of digestive organs: Secondary | ICD-10-CM

## 2014-08-21 DIAGNOSIS — R7689 Other specified abnormal immunological findings in serum: Secondary | ICD-10-CM

## 2014-08-21 DIAGNOSIS — R768 Other specified abnormal immunological findings in serum: Secondary | ICD-10-CM

## 2014-08-21 DIAGNOSIS — Z803 Family history of malignant neoplasm of breast: Secondary | ICD-10-CM

## 2014-08-21 DIAGNOSIS — C50412 Malignant neoplasm of upper-outer quadrant of left female breast: Secondary | ICD-10-CM

## 2014-08-21 DIAGNOSIS — Z5189 Encounter for other specified aftercare: Secondary | ICD-10-CM

## 2014-08-21 MED ORDER — PEGFILGRASTIM INJECTION 6 MG/0.6ML ~~LOC~~
6.0000 mg | PREFILLED_SYRINGE | Freq: Once | SUBCUTANEOUS | Status: AC
  Administered 2014-08-21: 6 mg via SUBCUTANEOUS

## 2014-08-21 NOTE — Patient Instructions (Signed)
Pegfilgrastim injection What is this medicine? PEGFILGRASTIM (peg fil GRA stim) is a long-acting granulocyte colony-stimulating factor that stimulates the growth of neutrophils, a type of white blood cell important in the body's fight against infection. It is used to reduce the incidence of fever and infection in patients with certain types of cancer who are receiving chemotherapy that affects the bone marrow. This medicine may be used for other purposes; ask your health care provider or pharmacist if you have questions. COMMON BRAND NAME(S): Neulasta What should I tell my health care provider before I take this medicine? They need to know if you have any of these conditions: -latex allergy -ongoing radiation therapy -sickle cell disease -skin reactions to acrylic adhesives (On-Body Injector only) -an unusual or allergic reaction to pegfilgrastim, filgrastim, other medicines, foods, dyes, or preservatives -pregnant or trying to get pregnant -breast-feeding How should I use this medicine? This medicine is for injection under the skin. If you get this medicine at home, you will be taught how to prepare and give the pre-filled syringe or how to use the On-body Injector. Refer to the patient Instructions for Use for detailed instructions. Use exactly as directed. Take your medicine at regular intervals. Do not take your medicine more often than directed. It is important that you put your used needles and syringes in a special sharps container. Do not put them in a trash can. If you do not have a sharps container, call your pharmacist or healthcare provider to get one. Talk to your pediatrician regarding the use of this medicine in children. Special care may be needed. Overdosage: If you think you have taken too much of this medicine contact a poison control center or emergency room at once. NOTE: This medicine is only for you. Do not share this medicine with others. What if I miss a dose? It is  important not to miss your dose. Call your doctor or health care professional if you miss your dose. If you miss a dose due to an On-body Injector failure or leakage, a new dose should be administered as soon as possible using a single prefilled syringe for manual use. What may interact with this medicine? Interactions have not been studied. Give your health care provider a list of all the medicines, herbs, non-prescription drugs, or dietary supplements you use. Also tell them if you smoke, drink alcohol, or use illegal drugs. Some items may interact with your medicine. This list may not describe all possible interactions. Give your health care provider a list of all the medicines, herbs, non-prescription drugs, or dietary supplements you use. Also tell them if you smoke, drink alcohol, or use illegal drugs. Some items may interact with your medicine. What should I watch for while using this medicine? You may need blood work done while you are taking this medicine. If you are going to need a MRI, CT scan, or other procedure, tell your doctor that you are using this medicine (On-Body Injector only). What side effects may I notice from receiving this medicine? Side effects that you should report to your doctor or health care professional as soon as possible: -allergic reactions like skin rash, itching or hives, swelling of the face, lips, or tongue -dizziness -fever -pain, redness, or irritation at site where injected -pinpoint red spots on the skin -shortness of breath or breathing problems -stomach or side pain, or pain at the shoulder -swelling -tiredness -trouble passing urine Side effects that usually do not require medical attention (report to your doctor   or health care professional if they continue or are bothersome): -bone pain -muscle pain This list may not describe all possible side effects. Call your doctor for medical advice about side effects. You may report side effects to FDA at  1-800-FDA-1088. Where should I keep my medicine? Keep out of the reach of children. Store pre-filled syringes in a refrigerator between 2 and 8 degrees C (36 and 46 degrees F). Do not freeze. Keep in carton to protect from light. Throw away this medicine if it is left out of the refrigerator for more than 48 hours. Throw away any unused medicine after the expiration date. NOTE: This sheet is a summary. It may not cover all possible information. If you have questions about this medicine, talk to your doctor, pharmacist, or health care provider.  2015, [employer]. (2013-12-17 16:14:05)  

## 2014-08-25 ENCOUNTER — Other Ambulatory Visit: Payer: Medicaid Other

## 2014-08-25 ENCOUNTER — Ambulatory Visit: Payer: Medicaid Other | Admitting: Nurse Practitioner

## 2014-08-27 ENCOUNTER — Encounter: Payer: Self-pay | Admitting: Adult Health

## 2014-08-27 ENCOUNTER — Ambulatory Visit (HOSPITAL_BASED_OUTPATIENT_CLINIC_OR_DEPARTMENT_OTHER): Payer: Medicaid Other | Admitting: Adult Health

## 2014-08-27 ENCOUNTER — Other Ambulatory Visit (HOSPITAL_BASED_OUTPATIENT_CLINIC_OR_DEPARTMENT_OTHER): Payer: Medicaid Other

## 2014-08-27 VITALS — BP 126/70 | HR 102 | Temp 98.3°F | Resp 18 | Ht 67.0 in | Wt 115.7 lb

## 2014-08-27 DIAGNOSIS — C50412 Malignant neoplasm of upper-outer quadrant of left female breast: Secondary | ICD-10-CM

## 2014-08-27 DIAGNOSIS — D6481 Anemia due to antineoplastic chemotherapy: Secondary | ICD-10-CM

## 2014-08-27 DIAGNOSIS — D0501 Lobular carcinoma in situ of right breast: Secondary | ICD-10-CM

## 2014-08-27 DIAGNOSIS — Z170 Estrogen receptor positive status [ER+]: Secondary | ICD-10-CM

## 2014-08-27 LAB — CBC WITH DIFFERENTIAL/PLATELET
BASO%: 2.7 % — ABNORMAL HIGH (ref 0.0–2.0)
BASOS ABS: 0.4 10*3/uL — AB (ref 0.0–0.1)
EOS ABS: 0.1 10*3/uL (ref 0.0–0.5)
EOS%: 0.5 % (ref 0.0–7.0)
HEMATOCRIT: 34.8 % (ref 34.8–46.6)
HEMOGLOBIN: 10.5 g/dL — AB (ref 11.6–15.9)
LYMPH%: 12.6 % — AB (ref 14.0–49.7)
MCH: 23.2 pg — ABNORMAL LOW (ref 25.1–34.0)
MCHC: 30.2 g/dL — ABNORMAL LOW (ref 31.5–36.0)
MCV: 76.8 fL — AB (ref 79.5–101.0)
MONO#: 1.5 10*3/uL — ABNORMAL HIGH (ref 0.1–0.9)
MONO%: 9.9 % (ref 0.0–14.0)
NEUT%: 74.3 % (ref 38.4–76.8)
NEUTROS ABS: 11.4 10*3/uL — AB (ref 1.5–6.5)
Platelets: 250 10*3/uL (ref 145–400)
RBC: 4.53 10*6/uL (ref 3.70–5.45)
RDW: 16.9 % — ABNORMAL HIGH (ref 11.2–14.5)
WBC: 15.3 10*3/uL — ABNORMAL HIGH (ref 3.9–10.3)
lymph#: 1.9 10*3/uL (ref 0.9–3.3)
nRBC: 0 % (ref 0–0)

## 2014-08-27 LAB — COMPREHENSIVE METABOLIC PANEL (CC13)
ALT: 17 U/L (ref 0–55)
ANION GAP: 8 meq/L (ref 3–11)
AST: 19 U/L (ref 5–34)
Albumin: 3.2 g/dL — ABNORMAL LOW (ref 3.5–5.0)
Alkaline Phosphatase: 98 U/L (ref 40–150)
BILIRUBIN TOTAL: 0.21 mg/dL (ref 0.20–1.20)
BUN: 7.8 mg/dL (ref 7.0–26.0)
CO2: 29 meq/L (ref 22–29)
CREATININE: 0.9 mg/dL (ref 0.6–1.1)
Calcium: 9 mg/dL (ref 8.4–10.4)
Chloride: 103 mEq/L (ref 98–109)
GLUCOSE: 118 mg/dL (ref 70–140)
Potassium: 4.6 mEq/L (ref 3.5–5.1)
SODIUM: 140 meq/L (ref 136–145)
Total Protein: 6.4 g/dL (ref 6.4–8.3)
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
## 2014-09-10 ENCOUNTER — Encounter: Payer: Self-pay | Admitting: Nurse Practitioner

## 2014-09-10 ENCOUNTER — Ambulatory Visit (HOSPITAL_BASED_OUTPATIENT_CLINIC_OR_DEPARTMENT_OTHER): Payer: Medicaid Other | Admitting: Nurse Practitioner

## 2014-09-10 ENCOUNTER — Other Ambulatory Visit: Payer: Medicaid Other

## 2014-09-10 ENCOUNTER — Other Ambulatory Visit (HOSPITAL_BASED_OUTPATIENT_CLINIC_OR_DEPARTMENT_OTHER): Payer: Medicaid Other

## 2014-09-10 ENCOUNTER — Ambulatory Visit (HOSPITAL_BASED_OUTPATIENT_CLINIC_OR_DEPARTMENT_OTHER): Payer: Medicaid Other

## 2014-09-10 ENCOUNTER — Other Ambulatory Visit: Payer: Self-pay | Admitting: Hematology and Oncology

## 2014-09-10 VITALS — BP 115/75 | HR 101 | Temp 98.6°F | Resp 18 | Ht 67.0 in | Wt 119.6 lb

## 2014-09-10 DIAGNOSIS — D630 Anemia in neoplastic disease: Secondary | ICD-10-CM | POA: Insufficient documentation

## 2014-09-10 DIAGNOSIS — C50412 Malignant neoplasm of upper-outer quadrant of left female breast: Secondary | ICD-10-CM

## 2014-09-10 DIAGNOSIS — Z170 Estrogen receptor positive status [ER+]: Secondary | ICD-10-CM

## 2014-09-10 DIAGNOSIS — D0501 Lobular carcinoma in situ of right breast: Secondary | ICD-10-CM

## 2014-09-10 DIAGNOSIS — D649 Anemia, unspecified: Secondary | ICD-10-CM

## 2014-09-10 DIAGNOSIS — Z5111 Encounter for antineoplastic chemotherapy: Secondary | ICD-10-CM

## 2014-09-10 LAB — CBC WITH DIFFERENTIAL/PLATELET
BASO%: 1.3 % (ref 0.0–2.0)
BASOS ABS: 0.1 10*3/uL (ref 0.0–0.1)
EOS%: 1.1 % (ref 0.0–7.0)
Eosinophils Absolute: 0.1 10*3/uL (ref 0.0–0.5)
HCT: 27 % — ABNORMAL LOW (ref 34.8–46.6)
HEMOGLOBIN: 8.2 g/dL — AB (ref 11.6–15.9)
LYMPH%: 19.9 % (ref 14.0–49.7)
MCH: 23.1 pg — AB (ref 25.1–34.0)
MCHC: 30.6 g/dL — ABNORMAL LOW (ref 31.5–36.0)
MCV: 75.5 fL — ABNORMAL LOW (ref 79.5–101.0)
MONO#: 0.9 10*3/uL (ref 0.1–0.9)
MONO%: 12.4 % (ref 0.0–14.0)
NEUT#: 4.8 10*3/uL (ref 1.5–6.5)
NEUT%: 65.3 % (ref 38.4–76.8)
Platelets: 340 10*3/uL (ref 145–400)
RBC: 3.57 10*6/uL — AB (ref 3.70–5.45)
RDW: 18.4 % — AB (ref 11.2–14.5)
WBC: 7.3 10*3/uL (ref 3.9–10.3)
lymph#: 1.5 10*3/uL (ref 0.9–3.3)

## 2014-09-10 LAB — COMPREHENSIVE METABOLIC PANEL (CC13)
ALBUMIN: 3.4 g/dL — AB (ref 3.5–5.0)
ALT: 18 U/L (ref 0–55)
AST: 18 U/L (ref 5–34)
Alkaline Phosphatase: 68 U/L (ref 40–150)
Anion Gap: 11 mEq/L (ref 3–11)
BUN: 11.2 mg/dL (ref 7.0–26.0)
CALCIUM: 8.7 mg/dL (ref 8.4–10.4)
CHLORIDE: 107 meq/L (ref 98–109)
CO2: 25 mEq/L (ref 22–29)
Creatinine: 0.8 mg/dL (ref 0.6–1.1)
EGFR: 85 mL/min/{1.73_m2} — ABNORMAL LOW (ref >90)
Glucose: 90 mg/dl (ref 70–140)
POTASSIUM: 3.5 meq/L (ref 3.5–5.1)
SODIUM: 144 meq/L (ref 136–145)
TOTAL PROTEIN: 6.5 g/dL (ref 6.4–8.3)
Total Bilirubin: 0.28 mg/dL (ref 0.20–1.20)

## 2014-09-10 MED ORDER — SODIUM CHLORIDE 0.9 % IV SOLN
600.0000 mg/m2 | Freq: Once | INTRAVENOUS | Status: AC
  Administered 2014-09-10: 920 mg via INTRAVENOUS
  Filled 2014-09-10: qty 46

## 2014-09-10 MED ORDER — HEPARIN SOD (PORK) LOCK FLUSH 100 UNIT/ML IV SOLN
500.0000 [IU] | Freq: Once | INTRAVENOUS | Status: DC | PRN
  Filled 2014-09-10: qty 5

## 2014-09-10 MED ORDER — SODIUM CHLORIDE 0.9 % IV SOLN
Freq: Once | INTRAVENOUS | Status: AC
  Administered 2014-09-10: 12:00:00 via INTRAVENOUS

## 2014-09-10 MED ORDER — DEXAMETHASONE SODIUM PHOSPHATE 20 MG/5ML IJ SOLN
20.0000 mg | Freq: Once | INTRAMUSCULAR | Status: AC
  Administered 2014-09-10: 20 mg via INTRAVENOUS

## 2014-09-10 MED ORDER — DOCETAXEL CHEMO INJECTION 160 MG/16ML
75.0000 mg/m2 | Freq: Once | INTRAVENOUS | Status: AC
  Administered 2014-09-10: 120 mg via INTRAVENOUS
  Filled 2014-09-10: qty 12

## 2014-09-10 MED ORDER — SODIUM CHLORIDE 0.9 % IJ SOLN
10.0000 mL | INTRAMUSCULAR | Status: DC | PRN
  Filled 2014-09-10: qty 10

## 2014-09-10 MED ORDER — ONDANSETRON 16 MG/50ML IVPB (CHCC)
INTRAVENOUS | Status: AC
  Filled 2014-09-10: qty 16

## 2014-09-10 MED ORDER — DEXAMETHASONE SODIUM PHOSPHATE 20 MG/5ML IJ SOLN
INTRAMUSCULAR | Status: AC
  Filled 2014-09-10: qty 5

## 2014-09-10 MED ORDER — ONDANSETRON 16 MG/50ML IVPB (CHCC)
16.0000 mg | Freq: Once | INTRAVENOUS | Status: AC
  Administered 2014-09-10: 16 mg via INTRAVENOUS
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
## 2014-09-10 NOTE — Patient Instructions (Signed)
[facility] Discharge Instructions for Patients Receiving Chemotherapy   To help prevent nausea and vomiting after your treatment, we encourage you to take your nausea medication.   If you develop nausea and vomiting that is not controlled by your nausea medication, call the clinic.   BELOW ARE SYMPTOMS THAT SHOULD BE REPORTED IMMEDIATELY:  *FEVER GREATER THAN 100.5 F  *CHILLS WITH OR WITHOUT FEVER  NAUSEA AND VOMITING THAT IS NOT CONTROLLED WITH YOUR NAUSEA MEDICATION  *UNUSUAL SHORTNESS OF BREATH  *UNUSUAL BRUISING OR BLEEDING  TENDERNESS IN MOUTH AND THROAT WITH OR WITHOUT PRESENCE OF ULCERS  *URINARY PROBLEMS  *BOWEL PROBLEMS  UNUSUAL RASH Items with * indicate a potential emergency and should be followed up as soon as possible.  Feel free to call the clinic you have any questions or concerns. The clinic phone number is [phone].

## 2014-09-11 ENCOUNTER — Ambulatory Visit (HOSPITAL_BASED_OUTPATIENT_CLINIC_OR_DEPARTMENT_OTHER): Payer: Medicaid Other

## 2014-09-11 DIAGNOSIS — Z5189 Encounter for other specified aftercare: Secondary | ICD-10-CM

## 2014-09-11 DIAGNOSIS — D0501 Lobular carcinoma in situ of right breast: Secondary | ICD-10-CM

## 2014-09-11 DIAGNOSIS — C50412 Malignant neoplasm of upper-outer quadrant of left female breast: Secondary | ICD-10-CM

## 2014-09-11 MED ORDER — PEGFILGRASTIM INJECTION 6 MG/0.6ML ~~LOC~~
6.0000 mg | PREFILLED_SYRINGE | Freq: Once | SUBCUTANEOUS | Status: AC
  Administered 2014-09-11: 6 mg via SUBCUTANEOUS

## 2014-09-11 NOTE — Patient Instructions (Signed)
Pegfilgrastim injection What is this medicine? PEGFILGRASTIM (peg fil GRA stim) is a long-acting granulocyte colony-stimulating factor that stimulates the growth of neutrophils, a type of white blood cell important in the body's fight against infection. It is used to reduce the incidence of fever and infection in patients with certain types of cancer who are receiving chemotherapy that affects the bone marrow. This medicine may be used for other purposes; ask your health care provider or pharmacist if you have questions. COMMON BRAND NAME(S): Neulasta What should I tell my health care provider before I take this medicine? They need to know if you have any of these conditions: -latex allergy -ongoing radiation therapy -sickle cell disease -skin reactions to acrylic adhesives (On-Body Injector only) -an unusual or allergic reaction to pegfilgrastim, filgrastim, other medicines, foods, dyes, or preservatives -pregnant or trying to get pregnant -breast-feeding How should I use this medicine? This medicine is for injection under the skin. If you get this medicine at home, you will be taught how to prepare and give the pre-filled syringe or how to use the On-body Injector. Refer to the patient Instructions for Use for detailed instructions. Use exactly as directed. Take your medicine at regular intervals. Do not take your medicine more often than directed. It is important that you put your used needles and syringes in a special sharps container. Do not put them in a trash can. If you do not have a sharps container, call your pharmacist or healthcare provider to get one. Talk to your pediatrician regarding the use of this medicine in children. Special care may be needed. Overdosage: If you think you have taken too much of this medicine contact a poison control center or emergency room at once. NOTE: This medicine is only for you. Do not share this medicine with others. What if I miss a dose? It is  important not to miss your dose. Call your doctor or health care professional if you miss your dose. If you miss a dose due to an On-body Injector failure or leakage, a new dose should be administered as soon as possible using a single prefilled syringe for manual use. What may interact with this medicine? Interactions have not been studied. Give your health care provider a list of all the medicines, herbs, non-prescription drugs, or dietary supplements you use. Also tell them if you smoke, drink alcohol, or use illegal drugs. Some items may interact with your medicine. This list may not describe all possible interactions. Give your health care provider a list of all the medicines, herbs, non-prescription drugs, or dietary supplements you use. Also tell them if you smoke, drink alcohol, or use illegal drugs. Some items may interact with your medicine. What should I watch for while using this medicine? You may need blood work done while you are taking this medicine. If you are going to need a MRI, CT scan, or other procedure, tell your doctor that you are using this medicine (On-Body Injector only). What side effects may I notice from receiving this medicine? Side effects that you should report to your doctor or health care professional as soon as possible: -allergic reactions like skin rash, itching or hives, swelling of the face, lips, or tongue -dizziness -fever -pain, redness, or irritation at site where injected -pinpoint red spots on the skin -shortness of breath or breathing problems -stomach or side pain, or pain at the shoulder -swelling -tiredness -trouble passing urine Side effects that usually do not require medical attention (report to your doctor   or health care professional if they continue or are bothersome): -bone pain -muscle pain This list may not describe all possible side effects. Call your doctor for medical advice about side effects. You may report side effects to FDA at  1-800-FDA-1088. Where should I keep my medicine? Keep out of the reach of children. Store pre-filled syringes in a refrigerator between 2 and 8 degrees C (36 and 46 degrees F). Do not freeze. Keep in carton to protect from light. Throw away this medicine if it is left out of the refrigerator for more than 48 hours. Throw away any unused medicine after the expiration date. NOTE: This sheet is a summary. It may not cover all possible information. If you have questions about this medicine, talk to your doctor, pharmacist, or health care provider.  2015, [employer]. (2013-12-17 16:14:05)  

## 2014-09-16 ENCOUNTER — Encounter: Payer: Self-pay | Admitting: *Deleted

## 2014-09-17 ENCOUNTER — Ambulatory Visit (HOSPITAL_BASED_OUTPATIENT_CLINIC_OR_DEPARTMENT_OTHER): Payer: Medicaid Other | Admitting: Oncology

## 2014-09-17 ENCOUNTER — Other Ambulatory Visit (HOSPITAL_BASED_OUTPATIENT_CLINIC_OR_DEPARTMENT_OTHER): Payer: Medicaid Other

## 2014-09-17 VITALS — BP 123/74 | HR 115 | Temp 98.2°F | Resp 18 | Ht 67.0 in | Wt 118.3 lb

## 2014-09-17 DIAGNOSIS — B1920 Unspecified viral hepatitis C without hepatic coma: Secondary | ICD-10-CM

## 2014-09-17 DIAGNOSIS — C50412 Malignant neoplasm of upper-outer quadrant of left female breast: Secondary | ICD-10-CM

## 2014-09-17 DIAGNOSIS — R768 Other specified abnormal immunological findings in serum: Secondary | ICD-10-CM

## 2014-09-17 DIAGNOSIS — M329 Systemic lupus erythematosus, unspecified: Secondary | ICD-10-CM

## 2014-09-17 DIAGNOSIS — R5383 Other fatigue: Secondary | ICD-10-CM

## 2014-09-17 DIAGNOSIS — C773 Secondary and unspecified malignant neoplasm of axilla and upper limb lymph nodes: Secondary | ICD-10-CM

## 2014-09-17 DIAGNOSIS — D649 Anemia, unspecified: Secondary | ICD-10-CM

## 2014-09-17 DIAGNOSIS — D630 Anemia in neoplastic disease: Secondary | ICD-10-CM

## 2014-09-17 LAB — CBC WITH DIFFERENTIAL/PLATELET
BASO%: 2.7 % — ABNORMAL HIGH (ref 0.0–2.0)
Basophils Absolute: 0.4 10*3/uL — ABNORMAL HIGH (ref 0.0–0.1)
EOS%: 0.5 % (ref 0.0–7.0)
Eosinophils Absolute: 0.1 10*3/uL (ref 0.0–0.5)
HCT: 31.3 % — ABNORMAL LOW (ref 34.8–46.6)
HGB: 9.3 g/dL — ABNORMAL LOW (ref 11.6–15.9)
LYMPH%: 9.7 % — ABNORMAL LOW (ref 14.0–49.7)
MCH: 23.3 pg — AB (ref 25.1–34.0)
MCHC: 29.7 g/dL — ABNORMAL LOW (ref 31.5–36.0)
MCV: 78.3 fL — AB (ref 79.5–101.0)
MONO#: 1.5 10*3/uL — ABNORMAL HIGH (ref 0.1–0.9)
MONO%: 10.3 % (ref 0.0–14.0)
NEUT%: 76.8 % (ref 38.4–76.8)
NEUTROS ABS: 11.2 10*3/uL — AB (ref 1.5–6.5)
PLATELETS: 217 10*3/uL (ref 145–400)
RBC: 4 10*6/uL (ref 3.70–5.45)
RDW: 18.8 % — AB (ref 11.2–14.5)
WBC: 14.6 10*3/uL — ABNORMAL HIGH (ref 3.9–10.3)
lymph#: 1.4 10*3/uL (ref 0.9–3.3)

## 2014-09-17 LAB — COMPREHENSIVE METABOLIC PANEL (CC13)
ALT: 22 U/L (ref 0–55)
ANION GAP: 11 meq/L (ref 3–11)
AST: 19 U/L (ref 5–34)
Albumin: 3.4 g/dL — ABNORMAL LOW (ref 3.5–5.0)
Alkaline Phosphatase: 96 U/L (ref 40–150)
BILIRUBIN TOTAL: 0.22 mg/dL (ref 0.20–1.20)
BUN: 9.4 mg/dL (ref 7.0–26.0)
CALCIUM: 8.8 mg/dL (ref 8.4–10.4)
CO2: 26 meq/L (ref 22–29)
CREATININE: 1 mg/dL (ref 0.6–1.1)
Chloride: 104 mEq/L (ref 98–109)
EGFR: 69 mL/min/{1.73_m2} — ABNORMAL LOW (ref >90)
Glucose: 114 mg/dl (ref 70–140)
Potassium: 3.9 mEq/L (ref 3.5–5.1)
Sodium: 141 mEq/L (ref 136–145)
Total Protein: 6.6 g/dL (ref 6.4–8.3)

## 2014-09-17 LAB — HOLD TUBE, BLOOD BANK

## 2014-09-17 LAB — FERRITIN CHCC: FERRITIN: 190 ng/mL (ref 9–269)
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
## 2014-09-17 NOTE — Addendum Note (Signed)
Addended by: Laureen Abrahams on: 09/17/2014 05:50 PM   Modules accepted: Orders, Medications

## 2014-09-30 ENCOUNTER — Ambulatory Visit (HOSPITAL_BASED_OUTPATIENT_CLINIC_OR_DEPARTMENT_OTHER): Payer: Medicaid Other | Admitting: Nurse Practitioner

## 2014-09-30 ENCOUNTER — Other Ambulatory Visit (HOSPITAL_BASED_OUTPATIENT_CLINIC_OR_DEPARTMENT_OTHER): Payer: Medicaid Other

## 2014-09-30 ENCOUNTER — Ambulatory Visit (HOSPITAL_BASED_OUTPATIENT_CLINIC_OR_DEPARTMENT_OTHER): Payer: Medicaid Other

## 2014-09-30 ENCOUNTER — Other Ambulatory Visit: Payer: Self-pay | Admitting: Oncology

## 2014-09-30 ENCOUNTER — Other Ambulatory Visit: Payer: Medicaid Other

## 2014-09-30 ENCOUNTER — Encounter: Payer: Self-pay | Admitting: Nurse Practitioner

## 2014-09-30 VITALS — BP 118/70 | HR 86 | Temp 98.1°F | Resp 18 | Ht 67.0 in | Wt 119.4 lb

## 2014-09-30 DIAGNOSIS — C50412 Malignant neoplasm of upper-outer quadrant of left female breast: Secondary | ICD-10-CM

## 2014-09-30 DIAGNOSIS — Z5111 Encounter for antineoplastic chemotherapy: Secondary | ICD-10-CM

## 2014-09-30 DIAGNOSIS — D6481 Anemia due to antineoplastic chemotherapy: Secondary | ICD-10-CM

## 2014-09-30 DIAGNOSIS — Z170 Estrogen receptor positive status [ER+]: Secondary | ICD-10-CM

## 2014-09-30 LAB — COMPREHENSIVE METABOLIC PANEL (CC13)
ALK PHOS: 80 U/L (ref 40–150)
ALT: 19 U/L (ref 0–55)
AST: 16 U/L (ref 5–34)
Albumin: 3.5 g/dL (ref 3.5–5.0)
Anion Gap: 11 mEq/L (ref 3–11)
BILIRUBIN TOTAL: 0.47 mg/dL (ref 0.20–1.20)
BUN: 12.7 mg/dL (ref 7.0–26.0)
CO2: 25 mEq/L (ref 22–29)
Calcium: 9.6 mg/dL (ref 8.4–10.4)
Chloride: 106 mEq/L (ref 98–109)
Creatinine: 0.8 mg/dL (ref 0.6–1.1)
EGFR: 88 mL/min/{1.73_m2} — ABNORMAL LOW (ref >90)
Glucose: 153 mg/dl — ABNORMAL HIGH (ref 70–140)
POTASSIUM: 3.9 meq/L (ref 3.5–5.1)
Sodium: 142 mEq/L (ref 136–145)
TOTAL PROTEIN: 7.1 g/dL (ref 6.4–8.3)

## 2014-09-30 LAB — CBC WITH DIFFERENTIAL/PLATELET
BASO%: 0 % (ref 0.0–2.0)
BASOS ABS: 0 10*3/uL (ref 0.0–0.1)
EOS ABS: 0 10*3/uL (ref 0.0–0.5)
EOS%: 0 % (ref 0.0–7.0)
HEMATOCRIT: 30.3 % — AB (ref 34.8–46.6)
HEMOGLOBIN: 9 g/dL — AB (ref 11.6–15.9)
LYMPH%: 7.8 % — ABNORMAL LOW (ref 14.0–49.7)
MCH: 23.9 pg — ABNORMAL LOW (ref 25.1–34.0)
MCHC: 29.7 g/dL — ABNORMAL LOW (ref 31.5–36.0)
MCV: 80.6 fL (ref 79.5–101.0)
MONO#: 0.2 10*3/uL (ref 0.1–0.9)
MONO%: 2.6 % (ref 0.0–14.0)
NEUT%: 89.6 % — ABNORMAL HIGH (ref 38.4–76.8)
NEUTROS ABS: 8.3 10*3/uL — AB (ref 1.5–6.5)
Platelets: 325 10*3/uL (ref 145–400)
RBC: 3.76 10*6/uL (ref 3.70–5.45)
RDW: 20.1 % — AB (ref 11.2–14.5)
WBC: 9.3 10*3/uL (ref 3.9–10.3)
lymph#: 0.7 10*3/uL — ABNORMAL LOW (ref 0.9–3.3)

## 2014-09-30 MED ORDER — HEPARIN SOD (PORK) LOCK FLUSH 100 UNIT/ML IV SOLN
500.0000 [IU] | Freq: Once | INTRAVENOUS | Status: AC | PRN
  Administered 2014-09-30: 500 [IU]
  Filled 2014-09-30: qty 5

## 2014-09-30 MED ORDER — DOCETAXEL CHEMO INJECTION 160 MG/16ML
75.0000 mg/m2 | Freq: Once | INTRAVENOUS | Status: AC
  Administered 2014-09-30: 120 mg via INTRAVENOUS
  Filled 2014-09-30: qty 12

## 2014-09-30 MED ORDER — ONDANSETRON 16 MG/50ML IVPB (CHCC)
16.0000 mg | Freq: Once | INTRAVENOUS | Status: AC
  Administered 2014-09-30: 16 mg via INTRAVENOUS

## 2014-09-30 MED ORDER — SODIUM CHLORIDE 0.9 % IV SOLN
600.0000 mg/m2 | Freq: Once | INTRAVENOUS | Status: AC
  Administered 2014-09-30: 920 mg via INTRAVENOUS
  Filled 2014-09-30: qty 46

## 2014-09-30 MED ORDER — DEXAMETHASONE SODIUM PHOSPHATE 20 MG/5ML IJ SOLN
INTRAMUSCULAR | Status: AC
  Filled 2014-09-30: qty 5

## 2014-09-30 MED ORDER — SODIUM CHLORIDE 0.9 % IJ SOLN
10.0000 mL | INTRAMUSCULAR | Status: DC | PRN
  Administered 2014-09-30: 10 mL
  Filled 2014-09-30: qty 10

## 2014-09-30 MED ORDER — SODIUM CHLORIDE 0.9 % IV SOLN
Freq: Once | INTRAVENOUS | Status: AC
  Administered 2014-09-30: 12:00:00 via INTRAVENOUS

## 2014-09-30 MED ORDER — ONDANSETRON 16 MG/50ML IVPB (CHCC)
INTRAVENOUS | Status: AC
  Filled 2014-09-30: qty 16

## 2014-09-30 MED ORDER — DEXAMETHASONE SODIUM PHOSPHATE 20 MG/5ML IJ SOLN
20.0000 mg | Freq: Once | INTRAMUSCULAR | Status: AC
  Administered 2014-09-30: 20 mg via INTRAVENOUS

## 2014-09-30 NOTE — Patient Instructions (Signed)
[facility] Discharge Instructions for Patients Receiving Chemotherapy  Today you received the following chemotherapy agents Taxotere and Cytoxan.  To help prevent nausea and vomiting after your treatment, we encourage you to take your nausea medication as prescribed.   If you develop nausea and vomiting that is not controlled by your nausea medication, call the clinic.   BELOW ARE SYMPTOMS THAT SHOULD BE REPORTED IMMEDIATELY:  *FEVER GREATER THAN 100.5 F  *CHILLS WITH OR WITHOUT FEVER  NAUSEA AND VOMITING THAT IS NOT CONTROLLED WITH YOUR NAUSEA MEDICATION  *UNUSUAL SHORTNESS OF BREATH  *UNUSUAL BRUISING OR BLEEDING  TENDERNESS IN MOUTH AND THROAT WITH OR WITHOUT PRESENCE OF ULCERS  *URINARY PROBLEMS  *BOWEL PROBLEMS  UNUSUAL RASH Items with * indicate a potential emergency and should be followed up as soon as possible.  Feel free to call the clinic you have any questions or concerns. The clinic phone number is [phone].
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
## 2014-10-02 ENCOUNTER — Other Ambulatory Visit: Payer: Self-pay | Admitting: Oncology

## 2014-10-02 ENCOUNTER — Ambulatory Visit (HOSPITAL_BASED_OUTPATIENT_CLINIC_OR_DEPARTMENT_OTHER): Payer: Medicaid Other

## 2014-10-02 VITALS — BP 123/64 | HR 83 | Temp 98.5°F

## 2014-10-02 DIAGNOSIS — Z5189 Encounter for other specified aftercare: Secondary | ICD-10-CM

## 2014-10-02 DIAGNOSIS — C50412 Malignant neoplasm of upper-outer quadrant of left female breast: Secondary | ICD-10-CM

## 2014-10-02 MED ORDER — PROCHLORPERAZINE MALEATE 10 MG PO TABS: 10.0000 mg | ORAL_TABLET | Freq: Four times a day (QID) | ORAL | Status: DC | PRN

## 2014-10-02 MED ORDER — PEGFILGRASTIM INJECTION 6 MG/0.6ML ~~LOC~~
6.0000 mg | PREFILLED_SYRINGE | Freq: Once | SUBCUTANEOUS | Status: AC
  Administered 2014-10-02: 6 mg via SUBCUTANEOUS

## 2014-10-08 ENCOUNTER — Other Ambulatory Visit (HOSPITAL_BASED_OUTPATIENT_CLINIC_OR_DEPARTMENT_OTHER): Payer: Medicaid Other

## 2014-10-08 ENCOUNTER — Ambulatory Visit (HOSPITAL_BASED_OUTPATIENT_CLINIC_OR_DEPARTMENT_OTHER): Payer: Medicaid Other | Admitting: Oncology

## 2014-10-08 ENCOUNTER — Telehealth: Payer: Self-pay | Admitting: Oncology

## 2014-10-08 VITALS — BP 107/64 | HR 102 | Temp 98.8°F | Resp 18 | Ht 67.0 in | Wt 120.9 lb

## 2014-10-08 DIAGNOSIS — D0501 Lobular carcinoma in situ of right breast: Secondary | ICD-10-CM

## 2014-10-08 DIAGNOSIS — C50412 Malignant neoplasm of upper-outer quadrant of left female breast: Secondary | ICD-10-CM

## 2014-10-08 DIAGNOSIS — D630 Anemia in neoplastic disease: Secondary | ICD-10-CM

## 2014-10-08 DIAGNOSIS — M329 Systemic lupus erythematosus, unspecified: Secondary | ICD-10-CM

## 2014-10-08 DIAGNOSIS — R768 Other specified abnormal immunological findings in serum: Secondary | ICD-10-CM

## 2014-10-08 DIAGNOSIS — Z170 Estrogen receptor positive status [ER+]: Secondary | ICD-10-CM

## 2014-10-08 LAB — COMPREHENSIVE METABOLIC PANEL (CC13)
ALBUMIN: 3.1 g/dL — AB (ref 3.5–5.0)
ALK PHOS: 89 U/L (ref 40–150)
ALT: 21 U/L (ref 0–55)
AST: 26 U/L (ref 5–34)
Anion Gap: 7 mEq/L (ref 3–11)
BUN: 10.5 mg/dL (ref 7.0–26.0)
CO2: 31 meq/L — AB (ref 22–29)
CREATININE: 0.9 mg/dL (ref 0.6–1.1)
Calcium: 8.9 mg/dL (ref 8.4–10.4)
Chloride: 105 mEq/L (ref 98–109)
EGFR: 79 mL/min/{1.73_m2} — ABNORMAL LOW (ref >90)
GLUCOSE: 109 mg/dL (ref 70–140)
POTASSIUM: 3.8 meq/L (ref 3.5–5.1)
Sodium: 143 mEq/L (ref 136–145)
Total Bilirubin: 0.27 mg/dL (ref 0.20–1.20)
Total Protein: 6.1 g/dL — ABNORMAL LOW (ref 6.4–8.3)

## 2014-10-08 LAB — CBC WITH DIFFERENTIAL/PLATELET
BASO%: 0.4 % (ref 0.0–2.0)
Basophils Absolute: 0.1 10*3/uL (ref 0.0–0.1)
EOS%: 0.1 % (ref 0.0–7.0)
Eosinophils Absolute: 0 10*3/uL (ref 0.0–0.5)
HCT: 29.2 % — ABNORMAL LOW (ref 34.8–46.6)
HGB: 8.7 g/dL — ABNORMAL LOW (ref 11.6–15.9)
LYMPH%: 3.6 % — AB (ref 14.0–49.7)
MCH: 23.2 pg — AB (ref 25.1–34.0)
MCHC: 29.9 g/dL — ABNORMAL LOW (ref 31.5–36.0)
MCV: 77.7 fL — ABNORMAL LOW (ref 79.5–101.0)
MONO#: 0.3 10*3/uL (ref 0.1–0.9)
MONO%: 0.9 % (ref 0.0–14.0)
NEUT#: 33.8 10*3/uL — ABNORMAL HIGH (ref 1.5–6.5)
NEUT%: 95 % — ABNORMAL HIGH (ref 38.4–76.8)
Platelets: 258 10*3/uL (ref 145–400)
RBC: 3.76 10*6/uL (ref 3.70–5.45)
RDW: 21.7 % — ABNORMAL HIGH (ref 11.2–14.5)
WBC: 35.6 10*3/uL — ABNORMAL HIGH (ref 3.9–10.3)
lymph#: 1.3 10*3/uL (ref 0.9–3.3)

## 2014-10-08 LAB — TECHNOLOGIST REVIEW
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
## 2014-10-08 NOTE — Telephone Encounter (Signed)
, °

## 2014-10-09 MED ORDER — TAMOXIFEN CITRATE 20 MG PO TABS: 20.0000 mg | ORAL_TABLET | Freq: Every day | ORAL | Status: AC

## 2014-10-13 ENCOUNTER — Ambulatory Visit (INDEPENDENT_AMBULATORY_CARE_PROVIDER_SITE_OTHER): Payer: Self-pay | Admitting: Surgery

## 2014-10-26 ENCOUNTER — Other Ambulatory Visit: Payer: Self-pay | Admitting: Oncology

## 2014-11-17 ENCOUNTER — Ambulatory Visit (HOSPITAL_BASED_OUTPATIENT_CLINIC_OR_DEPARTMENT_OTHER): Payer: Medicaid Other

## 2014-11-17 VITALS — BP 113/75 | HR 79 | Temp 98.7°F

## 2014-11-17 DIAGNOSIS — Z95828 Presence of other vascular implants and grafts: Secondary | ICD-10-CM

## 2014-11-17 DIAGNOSIS — C50412 Malignant neoplasm of upper-outer quadrant of left female breast: Secondary | ICD-10-CM

## 2014-11-17 DIAGNOSIS — Z452 Encounter for adjustment and management of vascular access device: Secondary | ICD-10-CM

## 2014-11-17 MED ORDER — HEPARIN SOD (PORK) LOCK FLUSH 100 UNIT/ML IV SOLN
500.0000 [IU] | Freq: Once | INTRAVENOUS | Status: AC
  Administered 2014-11-17: 500 [IU] via INTRAVENOUS
  Filled 2014-11-17: qty 5

## 2014-11-17 MED ORDER — SODIUM CHLORIDE 0.9 % IJ SOLN
10.0000 mL | INTRAMUSCULAR | Status: DC | PRN
  Administered 2014-11-17: 10 mL via INTRAVENOUS
  Filled 2014-11-17: qty 10

## 2014-11-17 NOTE — Patient Instructions (Signed)

## 2014-12-20 ENCOUNTER — Encounter (HOSPITAL_BASED_OUTPATIENT_CLINIC_OR_DEPARTMENT_OTHER): Payer: Self-pay | Admitting: *Deleted

## 2014-12-23 ENCOUNTER — Encounter (HOSPITAL_BASED_OUTPATIENT_CLINIC_OR_DEPARTMENT_OTHER): Payer: Self-pay | Admitting: *Deleted

## 2014-12-23 NOTE — H&P (Signed)
  General Surgery [facility]  Kendra Sullivan DOB: [birth year] Married / Language: English / Race: White Female  History of Present Illness  Patient returns for postoperative visit having undergone bilateral mastectomy on 05/18/2014.  Patient plans to begin chemotherapy at the end of October. She is scheduled for infusion port placement next week.   Other Problems Arthritis Breast Cancer Hemorrhoids Hepatitis  Past Surgical History  Breast Biopsy Left. Breast Mass; Local Excision Left. Knee Surgery Right. Mastectomy Bilateral. Sentinel Lymph Node Biopsy  Diagnostic Studies History Colonoscopy 5-10 years ago Mammogram within last year Pap Smear 1-5 years ago  Allergies Aspirin *ANALGESICS - NonNarcotic* Penicillamine *ASSORTED CLASSES* Sulfabenzamide *CHEMICALS*  Medication History  Hydroxychloroquine Sulfate (Oral) Specific dose unknown - Active. PredniSONE (Oral) Specific dose unknown - Active.  Social History Caffeine use Carbonated beverages, Tea. No alcohol use No drug use Tobacco use Never smoker.  Family History  Arthritis Father, Mother. Breast Cancer Mother. Colon Cancer Family Members In General. Diabetes Mellitus Mother. Heart Disease Mother. Heart disease in female family member before age 13  Pregnancy / Birth History Age at menarche 83 years. Gravida 2 Irregular periods Maternal age 71-35 Para 2  Review of Systems General Not Present- Appetite Loss, Chills, Fatigue, Fever, Night Sweats, Weight Gain and Weight Loss. Breast Not Present- Breast Mass, Breast Pain, Nipple Discharge and Skin Changes. Cardiovascular Not Present- Chest Pain, Difficulty Breathing Lying Down, Leg Cramps, Palpitations, Rapid Heart Rate, Shortness of Breath and Swelling of Extremities. Gastrointestinal Not Present- Abdominal Pain, Bloating, Bloody Stool, Change in Bowel Habits, Chronic diarrhea, Constipation,  Difficulty Swallowing, Excessive gas, Gets full quickly at meals, Hemorrhoids, Indigestion, Nausea, Rectal Pain and Vomiting. Musculoskeletal Present- Joint Pain and Muscle Pain. Not Present- Back Pain, Joint Stiffness, Muscle Weakness and Swelling of Extremities. Hematology Not Present- Easy Bruising, Excessive bleeding, Gland problems, HIV and Persistent Infections.   Vitals 07/07/2014 11:41 AM Weight: 107 lb Height: 67in Body Surface Area: 1.51 m Body Mass Index: 16.76 kg/m Temp.: 98.36F(Oral)  Pulse: 80 (Regular)  Resp.: 18 (Unlabored)  BP: 120/78 (Sitting, Left Arm, Standard)   Physical Exam  General - appears comfortable, no distress; not diaphorectic  HEENT - normocephalic; sclerae clear, gaze conjugate; mucous membranes moist, dentition good; voice normal  Neck - symmetric on extension; no palpable anterior or posterior cervical adenopathy; no palpable masses in the thyroid bed  Chest - Well-healed surgical incisions bilaterally; no sign of seroma; no sign of cellulitis; left axillary incision well-healed; port site well healed without seroma or infection  Ext - non-tender without significant edema or lymphedema  Neuro - grossly intact; no tremor    Assessment & Plan   PRIMARY CANCER OF UPPER OUTER QUADRANT OF LEFT FEMALE BREAST (174.4  C50.412)  Patient has completed chemotherapy and desires removal of infusion port.  This will be performed as an outpatient procedure under sedation and local anesthetic.  The risks and benefits of the procedure have been discussed at length with the patient.  The patient understands the proposed procedure, potential alternative treatments, and the course of recovery to be expected.  All of the patient's questions have been answered at this time.  The patient wishes to proceed with surgery.  [name], MD, [facility] Office: [phone]

## 2014-12-23 NOTE — Progress Notes (Signed)
UNABLE TO REACH PT ABOUT CHANGE OF FACILITY FROM [place] TO [facility].  LM ON TEMPORARY PHONE # [phone],  INSTRUCTION GIVEN NPO AFTER MN.  ARRIVE AT [facility] AT 0830. NEEDDS HG.

## 2014-12-27 ENCOUNTER — Encounter (HOSPITAL_BASED_OUTPATIENT_CLINIC_OR_DEPARTMENT_OTHER)
Admission: RE | Disposition: A | Discharge status: Home / Self Care | Payer: Self-pay | Source: Ambulatory Visit | Attending: Surgery

## 2014-12-27 ENCOUNTER — Ambulatory Visit (HOSPITAL_BASED_OUTPATIENT_CLINIC_OR_DEPARTMENT_OTHER): Payer: Medicaid Other | Admitting: Anesthesiology

## 2014-12-27 ENCOUNTER — Ambulatory Visit (HOSPITAL_BASED_OUTPATIENT_CLINIC_OR_DEPARTMENT_OTHER)
Admission: RE | Admit: 2014-12-27 | Discharge: 2014-12-27 | Disposition: A | Discharge status: Home / Self Care | Payer: Medicaid Other | Source: Ambulatory Visit | Attending: Surgery | Admitting: Surgery

## 2014-12-27 ENCOUNTER — Encounter (HOSPITAL_BASED_OUTPATIENT_CLINIC_OR_DEPARTMENT_OTHER): Payer: Self-pay | Admitting: Anesthesiology

## 2014-12-27 DIAGNOSIS — M1990 Unspecified osteoarthritis, unspecified site: Secondary | ICD-10-CM | POA: Diagnosis not present

## 2014-12-27 DIAGNOSIS — Z7952 Long term (current) use of systemic steroids: Secondary | ICD-10-CM | POA: Diagnosis not present

## 2014-12-27 DIAGNOSIS — Z853 Personal history of malignant neoplasm of breast: Secondary | ICD-10-CM | POA: Diagnosis not present

## 2014-12-27 DIAGNOSIS — Z170 Estrogen receptor positive status [ER+]: Secondary | ICD-10-CM | POA: Diagnosis present

## 2014-12-27 DIAGNOSIS — Z452 Encounter for adjustment and management of vascular access device: Secondary | ICD-10-CM | POA: Diagnosis present

## 2014-12-27 DIAGNOSIS — Z9221 Personal history of antineoplastic chemotherapy: Secondary | ICD-10-CM | POA: Insufficient documentation

## 2014-12-27 DIAGNOSIS — Z803 Family history of malignant neoplasm of breast: Secondary | ICD-10-CM | POA: Diagnosis not present

## 2014-12-27 DIAGNOSIS — Z79899 Other long term (current) drug therapy: Secondary | ICD-10-CM | POA: Insufficient documentation

## 2014-12-27 DIAGNOSIS — Z9013 Acquired absence of bilateral breasts and nipples: Secondary | ICD-10-CM | POA: Diagnosis not present

## 2014-12-27 DIAGNOSIS — B1920 Unspecified viral hepatitis C without hepatic coma: Secondary | ICD-10-CM | POA: Diagnosis not present

## 2014-12-27 DIAGNOSIS — M329 Systemic lupus erythematosus, unspecified: Secondary | ICD-10-CM | POA: Diagnosis not present

## 2014-12-27 DIAGNOSIS — C50412 Malignant neoplasm of upper-outer quadrant of left female breast: Secondary | ICD-10-CM | POA: Diagnosis present

## 2014-12-27 HISTORY — DX: Presence of artificial eye: Z97.0

## 2014-12-27 HISTORY — PX: PORT-A-CATH REMOVAL: SHX5289

## 2014-12-27 HISTORY — DX: Personal history of traumatic brain injury: Z87.820

## 2014-12-27 LAB — POCT HEMOGLOBIN-HEMACUE: Hemoglobin: 10.7 g/dL — ABNORMAL LOW (ref 12.0–15.0)

## 2014-12-27 SURGERY — REMOVAL PORT-A-CATH
Anesthesia: Monitor Anesthesia Care | Site: Chest

## 2014-12-27 MED ORDER — CIPROFLOXACIN IN D5W 400 MG/200ML IV SOLN
400.0000 mg | INTRAVENOUS | Status: AC
  Administered 2014-12-27: 400 mg via INTRAVENOUS
  Filled 2014-12-27: qty 200

## 2014-12-27 MED ORDER — MIDAZOLAM HCL 5 MG/5ML IJ SOLN
INTRAMUSCULAR | Status: DC | PRN
  Administered 2014-12-27: 0.5 mg via INTRAVENOUS
  Administered 2014-12-27 (×2): 1 mg via INTRAVENOUS
  Administered 2014-12-27: 0.5 mg via INTRAVENOUS
  Administered 2014-12-27: 1 mg via INTRAVENOUS

## 2014-12-27 MED ORDER — LIDOCAINE HCL (CARDIAC) 20 MG/ML IV SOLN
INTRAVENOUS | Status: DC | PRN
  Administered 2014-12-27: 80 mg via INTRAVENOUS

## 2014-12-27 MED ORDER — LIDOCAINE HCL (PF) 1 % IJ SOLN
INTRAMUSCULAR | Status: DC | PRN
  Administered 2014-12-27: 9 mL

## 2014-12-27 MED ORDER — PROPOFOL 10 MG/ML IV EMUL
INTRAVENOUS | Status: DC | PRN
  Administered 2014-12-27: 50 ug/kg/min via INTRAVENOUS

## 2014-12-27 MED ORDER — CIPROFLOXACIN IN D5W 400 MG/200ML IV SOLN
INTRAVENOUS | Status: AC
  Filled 2014-12-27: qty 200

## 2014-12-27 MED ORDER — DIPHENHYDRAMINE HCL 50 MG/ML IJ SOLN
INTRAMUSCULAR | Status: DC | PRN
  Administered 2014-12-27 (×2): 12.5 mg via INTRAVENOUS

## 2014-12-27 MED ORDER — ONDANSETRON HCL 4 MG/2ML IJ SOLN
4.0000 mg | Freq: Once | INTRAMUSCULAR | Status: DC | PRN
  Filled 2014-12-27: qty 2

## 2014-12-27 MED ORDER — HYDROCODONE-ACETAMINOPHEN 5-325 MG PO TABS
ORAL_TABLET | ORAL | Status: AC
  Filled 2014-12-27: qty 1

## 2014-12-27 MED ORDER — FENTANYL CITRATE 0.05 MG/ML IJ SOLN
INTRAMUSCULAR | Status: DC | PRN
  Administered 2014-12-27 (×8): 12.5 ug via INTRAVENOUS

## 2014-12-27 MED ORDER — ONDANSETRON HCL 4 MG/2ML IJ SOLN
INTRAMUSCULAR | Status: DC | PRN
  Administered 2014-12-27: 4 mg via INTRAVENOUS

## 2014-12-27 MED ORDER — HYDROCODONE-ACETAMINOPHEN 5-325 MG PO TABS: 1.0000 | ORAL_TABLET | ORAL | Status: DC | PRN

## 2014-12-27 MED ORDER — HYDROMORPHONE HCL 1 MG/ML IJ SOLN
0.2500 mg | INTRAMUSCULAR | Status: DC | PRN
  Filled 2014-12-27: qty 1

## 2014-12-27 MED ORDER — SODIUM CHLORIDE 0.9 % IR SOLN
Status: DC | PRN
  Administered 2014-12-27: 500 mL

## 2014-12-27 MED ORDER — FENTANYL CITRATE 0.05 MG/ML IJ SOLN
INTRAMUSCULAR | Status: AC
  Filled 2014-12-27: qty 4

## 2014-12-27 MED ORDER — MIDAZOLAM HCL 2 MG/2ML IJ SOLN
INTRAMUSCULAR | Status: AC
  Filled 2014-12-27: qty 4

## 2014-12-27 MED ORDER — HYDROCODONE-ACETAMINOPHEN 5-325 MG PO TABS
1.0000 | ORAL_TABLET | ORAL | Status: DC | PRN
  Administered 2014-12-27: 1 via ORAL
  Filled 2014-12-27: qty 2

## 2014-12-27 MED ORDER — LACTATED RINGERS IV SOLN
INTRAVENOUS | Status: DC
  Administered 2014-12-27: 09:00:00 via INTRAVENOUS
  Filled 2014-12-27: qty 1000

## 2014-12-27 MED ORDER — DEXAMETHASONE SODIUM PHOSPHATE 4 MG/ML IJ SOLN
INTRAMUSCULAR | Status: DC | PRN
  Administered 2014-12-27: 4 mg via INTRAVENOUS

## 2014-12-27 SURGICAL SUPPLY — 32 items
BLADE HEX COATED 2.75 (ELECTRODE) ×2 IMPLANT
BLADE SURG 15 STRL LF DISP TIS (BLADE) ×1 IMPLANT
BLADE SURG 15 STRL SS (BLADE) ×2
CHLORAPREP W/TINT 26ML (MISCELLANEOUS) ×2 IMPLANT
COVER MAYO STAND STRL (DRAPES) ×2 IMPLANT
COVER TABLE BACK 60X90 (DRAPES) ×2 IMPLANT
DECANTER SPIKE VIAL GLASS SM (MISCELLANEOUS) ×2 IMPLANT
DRAPE PED LAPAROTOMY (DRAPES) ×2 IMPLANT
DRAPE UTILITY XL STRL (DRAPES) ×2 IMPLANT
ELECT REM PT RETURN 9FT ADLT (ELECTROSURGICAL) ×2
ELECTRODE REM PT RTRN 9FT ADLT (ELECTROSURGICAL) ×1 IMPLANT
GLOVE BIO SURGEON STRL SZ 6.5 (GLOVE) ×1 IMPLANT
GLOVE BIO SURGEON STRL SZ8 (GLOVE) ×2 IMPLANT
GLOVE INDICATOR 7.0 STRL GRN (GLOVE) ×1 IMPLANT
GOWN STRL REUS W/ TWL LRG LVL3 (GOWN DISPOSABLE) ×1 IMPLANT
GOWN STRL REUS W/ TWL XL LVL3 (GOWN DISPOSABLE) ×1 IMPLANT
GOWN STRL REUS W/TWL LRG LVL3 (GOWN DISPOSABLE) ×4
GOWN STRL REUS W/TWL XL LVL3 (GOWN DISPOSABLE) ×4
LIQUID BAND (GAUZE/BANDAGES/DRESSINGS) ×2 IMPLANT
NDL HYPO 25X1 1.5 SAFETY (NEEDLE) ×1 IMPLANT
NEEDLE HYPO 25X1 1.5 SAFETY (NEEDLE) ×2 IMPLANT
NS IRRIG 500ML POUR BTL (IV SOLUTION) ×2 IMPLANT
PACK BASIN DAY SURGERY FS (CUSTOM PROCEDURE TRAY) ×2 IMPLANT
PAD ARMBOARD 7.5X6 YLW CONV (MISCELLANEOUS) ×4 IMPLANT
PENCIL BUTTON HOLSTER BLD 10FT (ELECTRODE) ×2 IMPLANT
SUT MNCRL AB 4-0 PS2 18 (SUTURE) ×2 IMPLANT
SUT MON AB 4-0 PC3 18 (SUTURE) IMPLANT
SUT VIC AB 3-0 SH 27 (SUTURE) ×2
SUT VIC AB 3-0 SH 27X BRD (SUTURE) ×1 IMPLANT
SYR CONTROL 10ML LL (SYRINGE) ×2 IMPLANT
TOWEL OR 17X24 6PK STRL BLUE (TOWEL DISPOSABLE) ×4 IMPLANT
WATER STERILE IRR 500ML POUR (IV SOLUTION) IMPLANT

## 2014-12-27 NOTE — Anesthesia Procedure Notes (Signed)
Procedure Name: MAC Date/Time: 12/27/2014 9:41 AM Performed by: Justice Rocher Pre-anesthesia Checklist: Patient identified, Timeout performed, Emergency Drugs available, Suction available and Patient being monitored Patient Re-evaluated:Patient Re-evaluated prior to inductionOxygen Delivery Method: Simple face mask Preoxygenation: Pre-oxygenation with 100% oxygen Intubation Type: IV induction Placement Confirmation: positive ETCO2 and breath sounds checked- equal and bilateral

## 2014-12-27 NOTE — Op Note (Signed)
OPERATIVE REPORT - INFUSION PORT REMOVAL  Preoperative diagnosis: Breast Cancer  Postop diagnosis: Same  Procedure: removal of infusion port  Surgeon:  [name], MD, FACS  Anesthesia: local with IV sedation  Estimated blood loss: Minimal  Preparation: ChloraPrep  Indications: Patient with history of breast cancer.  Completed adjuvant chemotherapy.  Desires removal of infusion port.  Procedure: Patient is brought to OR#1 and placed in a supine position.  After administration of IV sedation, the patient is prepped and draped in the usual aseptic fashion.  Skin at the site of the port is anesthetized with local anesthetic.  The previous incision is re-opened with a #15 blade.  Dissection is carried down to the port and the fibrous sheath opened.  Two prolene sutures are removed.  Port is removed.  Catheter tract is closed with a figure of eight 3-0 vicryl suture.  Fibrous sheath is cauterized.  Subcutaneous tissue is closed with interrupted 3-0 vicryl sutures.  Skin is closed with a 4-0 monocryl subcuticular suture.  Wound is washed and dried and topical glue is applied as dressing.  [name], MD, [facility]

## 2014-12-27 NOTE — Anesthesia Postprocedure Evaluation (Signed)
  Anesthesia Post-op Note  Patient: Kendra Sullivan  Procedure(s) Performed: Procedure(s): REMOVAL PORT-A-CATH (N/A)  Patient Location: PACU  Anesthesia Type:MAC  Level of Consciousness: awake, alert , oriented and patient cooperative  Airway and Oxygen Therapy: Patient Spontanous Breathing  Post-op Pain: mild  Post-op Assessment: Post-op Vital signs reviewed, Patient's Cardiovascular Status Stable, Respiratory Function Stable, Patent Airway, No signs of Nausea or vomiting and Pain level controlled  Post-op Vital Signs: stable  Last Vitals:  Filed Vitals:   12/27/14 1123  BP: 102/61  Pulse: 68  Temp: 37 C  Resp: 16    Complications: No apparent anesthesia complications

## 2014-12-27 NOTE — Interval H&P Note (Signed)
History and Physical Interval Note:  12/27/2014 9:17 AM  Kendra Sullivan  has presented today for surgery, with the diagnosis of breast cancer.  The various methods of treatment have been discussed with the patient and family. After consideration of risks, benefits and other options for treatment, the patient has consented to    Procedure(s): REMOVAL PORT-A-CATH (N/A) as a surgical intervention .    The patient's history has been reviewed, patient examined, no change in status, stable for surgery.  I have reviewed the patient's chart and labs.  Questions were answered to the patient's satisfaction.    [name], MD, [facility] Office: [place]

## 2014-12-27 NOTE — Discharge Instructions (Signed)

## 2014-12-27 NOTE — Transfer of Care (Signed)
Immediate Anesthesia Transfer of Care Note  Patient: Kendra Sullivan  Procedure(s) Performed: Procedure(s) (LRB): REMOVAL PORT-A-CATH (N/A)  Patient Location: PACU  Anesthesia Type: MAC  Level of Consciousness: awake, sedated, patient cooperative and responds to stimulation  Airway & Oxygen Therapy: Patient Spontanous Breathing and Patient connected to face mask oxygen  Post-op Assessment: Report given to PACU RN, Post -op Vital signs reviewed and stable and Patient moving all extremities  Post vital signs: Reviewed and stable  Complications: No apparent anesthesia complications

## 2014-12-27 NOTE — Anesthesia Preprocedure Evaluation (Addendum)
Anesthesia Evaluation  Patient identified by MRN, date of birth, ID band Patient awake    Reviewed: Allergy & Precautions, NPO status , Patient's Chart, lab work & pertinent test results  History of Anesthesia Complications (+) PONV  Airway        Dental   Pulmonary          Cardiovascular     Neuro/Psych    GI/Hepatic (+) Hepatitis -, C  Endo/Other    Renal/GU      Musculoskeletal   Abdominal   Peds  Hematology  (+) anemia ,   Anesthesia Other Findings SLE Breast CA  Reproductive/Obstetrics                            Anesthesia Physical Anesthesia Plan  ASA: II  Anesthesia Plan: MAC   Post-op Pain Management:    Induction: Intravenous  Airway Management Planned: Simple Face Mask  Additional Equipment:   Intra-op Plan:   Post-operative Plan:   Informed Consent: I have reviewed the patients History and Physical, chart, labs and discussed the procedure including the risks, benefits and alternatives for the proposed anesthesia with the patient or authorized representative who has indicated his/her understanding and acceptance.     Plan Discussed with: CRNA, Anesthesiologist and Surgeon  Anesthesia Plan Comments:         Anesthesia Quick Evaluation

## 2014-12-28 ENCOUNTER — Encounter (HOSPITAL_BASED_OUTPATIENT_CLINIC_OR_DEPARTMENT_OTHER): Payer: Self-pay | Admitting: Surgery

## 2015-01-28 ENCOUNTER — Ambulatory Visit (HOSPITAL_BASED_OUTPATIENT_CLINIC_OR_DEPARTMENT_OTHER): Payer: Medicaid Other | Admitting: Oncology

## 2015-01-28 ENCOUNTER — Other Ambulatory Visit (HOSPITAL_BASED_OUTPATIENT_CLINIC_OR_DEPARTMENT_OTHER): Payer: Medicaid Other

## 2015-01-28 ENCOUNTER — Ambulatory Visit: Payer: Medicaid Other

## 2015-01-28 ENCOUNTER — Telehealth: Payer: Self-pay | Admitting: Oncology

## 2015-01-28 VITALS — BP 117/68 | HR 85 | Temp 98.6°F | Resp 18 | Ht 67.0 in | Wt 115.3 lb

## 2015-01-28 DIAGNOSIS — Z170 Estrogen receptor positive status [ER+]: Secondary | ICD-10-CM | POA: Diagnosis not present

## 2015-01-28 DIAGNOSIS — D0501 Lobular carcinoma in situ of right breast: Secondary | ICD-10-CM

## 2015-01-28 DIAGNOSIS — C50412 Malignant neoplasm of upper-outer quadrant of left female breast: Secondary | ICD-10-CM | POA: Diagnosis present

## 2015-01-28 DIAGNOSIS — D569 Thalassemia, unspecified: Secondary | ICD-10-CM | POA: Insufficient documentation

## 2015-01-28 LAB — COMPREHENSIVE METABOLIC PANEL (CC13)
ALBUMIN: 3 g/dL — AB (ref 3.5–5.0)
ALT: 11 U/L (ref 0–55)
AST: 13 U/L (ref 5–34)
Alkaline Phosphatase: 54 U/L (ref 40–150)
Anion Gap: 10 mEq/L (ref 3–11)
BILIRUBIN TOTAL: 0.21 mg/dL (ref 0.20–1.20)
BUN: 9.3 mg/dL (ref 7.0–26.0)
CHLORIDE: 106 meq/L (ref 98–109)
CO2: 27 mEq/L (ref 22–29)
CREATININE: 1 mg/dL (ref 0.6–1.1)
Calcium: 8.2 mg/dL — ABNORMAL LOW (ref 8.4–10.4)
EGFR: 70 mL/min/{1.73_m2} — ABNORMAL LOW (ref >90)
Glucose: 101 mg/dl (ref 70–140)
POTASSIUM: 4 meq/L (ref 3.5–5.1)
Sodium: 143 mEq/L (ref 136–145)
TOTAL PROTEIN: 6.5 g/dL (ref 6.4–8.3)

## 2015-01-28 LAB — CBC WITH DIFFERENTIAL/PLATELET
BASO%: 0.3 % (ref 0.0–2.0)
BASOS ABS: 0 10*3/uL (ref 0.0–0.1)
EOS ABS: 0.1 10*3/uL (ref 0.0–0.5)
EOS%: 0.7 % (ref 0.0–7.0)
HCT: 32.1 % — ABNORMAL LOW (ref 34.8–46.6)
HGB: 9.6 g/dL — ABNORMAL LOW (ref 11.6–15.9)
LYMPH%: 6.7 % — ABNORMAL LOW (ref 14.0–49.7)
MCH: 21.7 pg — AB (ref 25.1–34.0)
MCHC: 29.9 g/dL — ABNORMAL LOW (ref 31.5–36.0)
MCV: 72.6 fL — ABNORMAL LOW (ref 79.5–101.0)
MONO#: 0.3 10*3/uL (ref 0.1–0.9)
MONO%: 3.6 % (ref 0.0–14.0)
NEUT#: 6.9 10*3/uL — ABNORMAL HIGH (ref 1.5–6.5)
NEUT%: 88.7 % — AB (ref 38.4–76.8)
Platelets: 358 10*3/uL (ref 145–400)
RBC: 4.41 10*6/uL (ref 3.70–5.45)
RDW: 19.8 % — ABNORMAL HIGH (ref 11.2–14.5)
WBC: 7.8 10*3/uL (ref 3.9–10.3)
lymph#: 0.5 10*3/uL — ABNORMAL LOW (ref 0.9–3.3)

## 2015-01-28 MED ORDER — SODIUM CHLORIDE 0.9 % IJ SOLN
10.0000 mL | INTRAMUSCULAR | Status: AC | PRN
  Filled 2015-01-28: qty 10

## 2015-01-28 MED ORDER — HEPARIN SOD (PORK) LOCK FLUSH 100 UNIT/ML IV SOLN
500.0000 [IU] | Freq: Once | INTRAVENOUS | Status: DC
  Filled 2015-01-28: qty 5
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
## 2015-01-28 NOTE — Telephone Encounter (Signed)
per pof to sch pt appt-gave tp copy of sch °

## 2015-04-07 IMAGING — CR DG CHEST 1V PORT
1 series · 1 of 1 positions shown · non-contrast
Comparison: Chest CT 03/29/2011 and radiographs 03/28/2011

CLINICAL DATA: Postop Port-A-Cath placement.

EXAM:
PORTABLE CHEST - 1 VIEW

[AP]
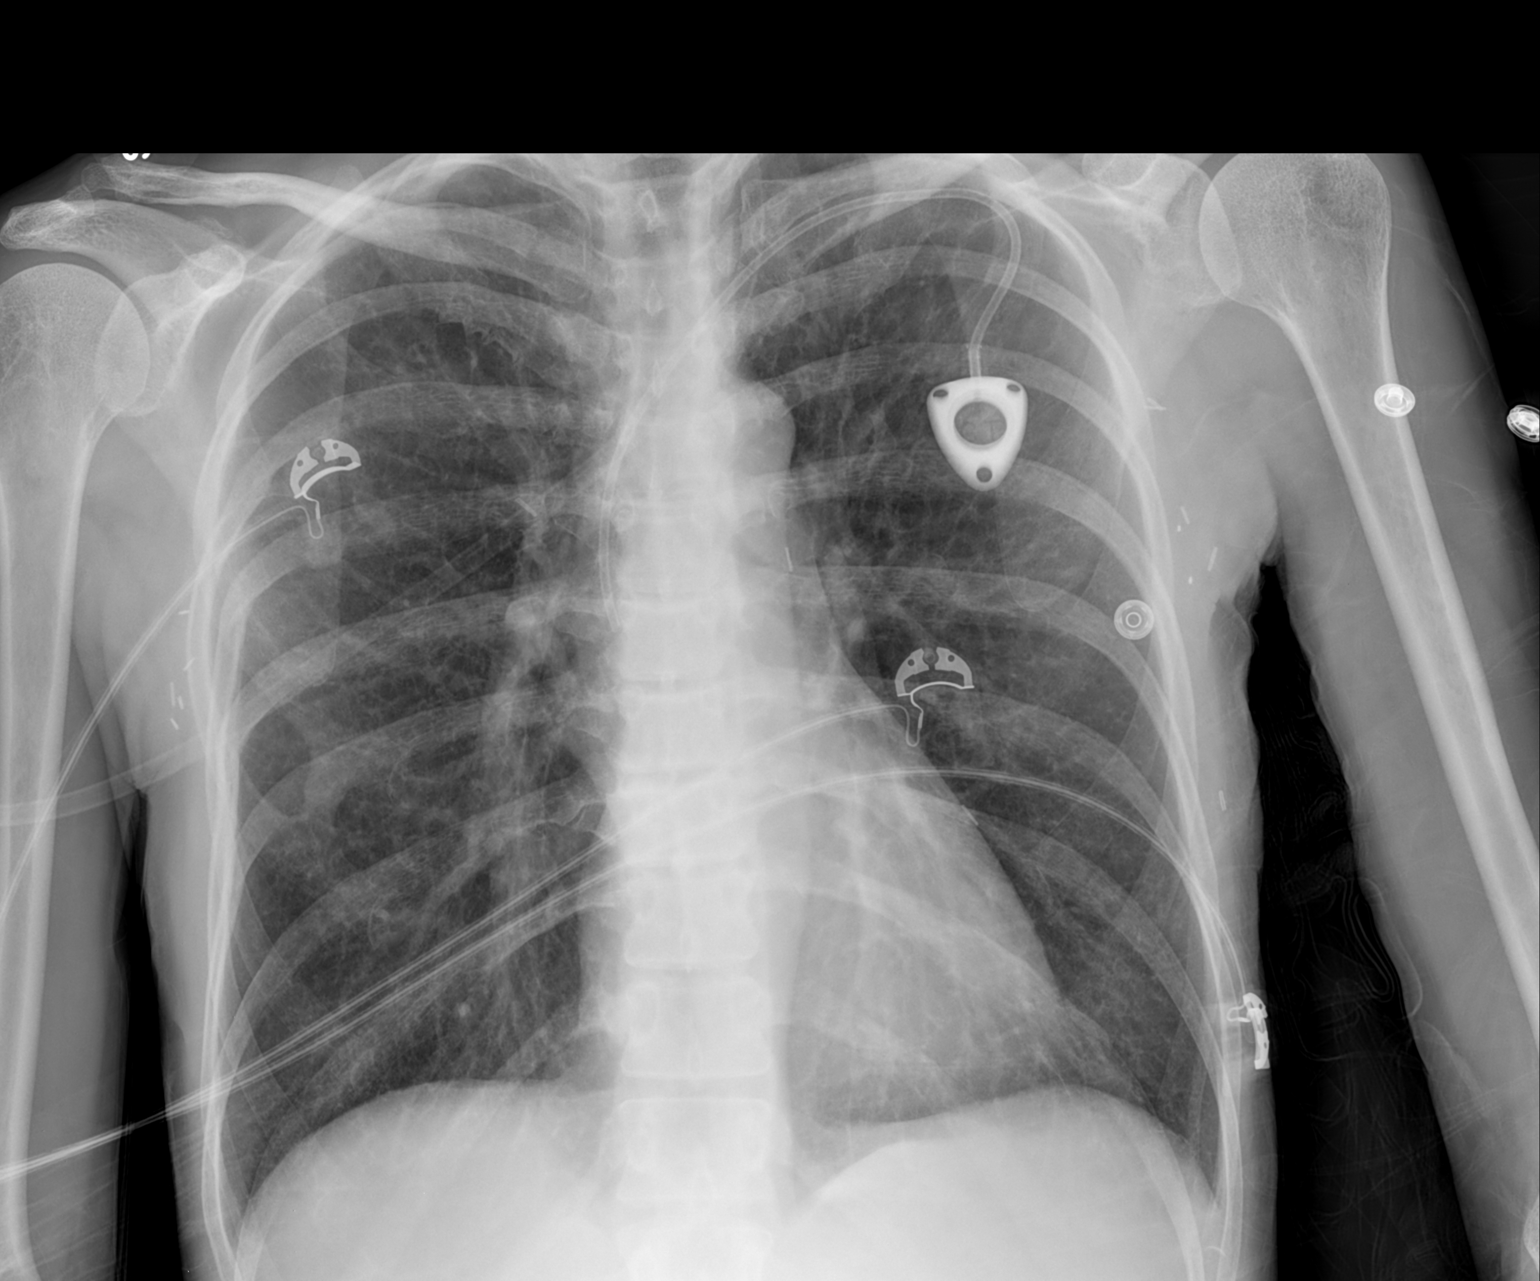

[1 of 1 positions shown; findings below may reference images not displayed]

FINDINGS: Left subclavian Port-A-Cath terminates over the mid to lower SVC.
Cardiomediastinal silhouette is within normal limits. The lungs are
mildly hyperinflated with mildly increased interstitial markings
bilaterally. No confluent airspace opacity, pleural effusion, or
pneumothorax is identified. Surgical clips are present in the
axillae. Lower cervical spine fusion hardware is partially
visualized.
IMPRESSION: 1. Left subclavian Port-A-Cath terminating over the mid to lower
SVC.
2. Increased interstitial markings bilaterally, which may reflect
acute or chronic bronchitis.

## 2015-05-02 ENCOUNTER — Other Ambulatory Visit (HOSPITAL_BASED_OUTPATIENT_CLINIC_OR_DEPARTMENT_OTHER): Payer: Self-pay

## 2015-05-02 ENCOUNTER — Ambulatory Visit (HOSPITAL_BASED_OUTPATIENT_CLINIC_OR_DEPARTMENT_OTHER): Payer: Self-pay | Admitting: Nurse Practitioner

## 2015-05-02 ENCOUNTER — Other Ambulatory Visit: Payer: Self-pay | Admitting: *Deleted

## 2015-05-02 ENCOUNTER — Encounter: Payer: Self-pay | Admitting: Nurse Practitioner

## 2015-05-02 VITALS — BP 116/71 | HR 84 | Temp 98.6°F | Resp 18 | Ht 67.0 in | Wt 117.7 lb

## 2015-05-02 DIAGNOSIS — D0501 Lobular carcinoma in situ of right breast: Secondary | ICD-10-CM

## 2015-05-02 DIAGNOSIS — C50412 Malignant neoplasm of upper-outer quadrant of left female breast: Secondary | ICD-10-CM

## 2015-05-02 DIAGNOSIS — D509 Iron deficiency anemia, unspecified: Secondary | ICD-10-CM

## 2015-05-02 DIAGNOSIS — Z79810 Long term (current) use of selective estrogen receptor modulators (SERMs): Secondary | ICD-10-CM

## 2015-05-02 DIAGNOSIS — Z170 Estrogen receptor positive status [ER+]: Secondary | ICD-10-CM

## 2015-05-02 LAB — COMPREHENSIVE METABOLIC PANEL (CC13)
ALK PHOS: 49 U/L (ref 40–150)
ALT: 13 U/L (ref 0–55)
AST: 17 U/L (ref 5–34)
Albumin: 2.9 g/dL — ABNORMAL LOW (ref 3.5–5.0)
Anion Gap: 6 mEq/L (ref 3–11)
BUN: 12.1 mg/dL (ref 7.0–26.0)
CALCIUM: 8.5 mg/dL (ref 8.4–10.4)
CO2: 28 meq/L (ref 22–29)
CREATININE: 0.9 mg/dL (ref 0.6–1.1)
Chloride: 108 mEq/L (ref 98–109)
EGFR: 78 mL/min/{1.73_m2} — ABNORMAL LOW (ref >90)
GLUCOSE: 105 mg/dL (ref 70–140)
Potassium: 3.4 mEq/L — ABNORMAL LOW (ref 3.5–5.1)
Sodium: 142 mEq/L (ref 136–145)
TOTAL PROTEIN: 6.7 g/dL (ref 6.4–8.3)

## 2015-05-02 LAB — FERRITIN CHCC: Ferritin: 24 ng/ml (ref 9–269)

## 2015-05-02 MED ORDER — HYDROCODONE-ACETAMINOPHEN 5-325 MG PO TABS: 1.0000 | ORAL_TABLET | ORAL | Status: DC | PRN

## 2015-05-02 MED ORDER — TAMOXIFEN CITRATE 20 MG PO TABS: 20.0000 mg | ORAL_TABLET | Freq: Every day | ORAL | Status: DC
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
## 2015-05-04 ENCOUNTER — Other Ambulatory Visit: Payer: Self-pay | Admitting: Oncology

## 2015-05-04 ENCOUNTER — Encounter: Payer: Self-pay | Admitting: *Deleted

## 2015-05-04 NOTE — Progress Notes (Signed)
Received faxed paperwork from [facility] requesting medical clearance from Dr. Jana Hakim. Paperwork signed and faxed back. Fax # [phone]. Paperwork sent to be scanned.

## 2015-05-10 ENCOUNTER — Other Ambulatory Visit: Payer: Self-pay | Admitting: Obstetrics and Gynecology

## 2015-05-11 LAB — CYTOLOGY - PAP

## 2015-08-02 ENCOUNTER — Other Ambulatory Visit: Payer: Medicaid Other

## 2015-08-02 ENCOUNTER — Telehealth: Payer: Self-pay | Admitting: Oncology

## 2015-08-02 ENCOUNTER — Ambulatory Visit: Payer: Medicaid Other | Admitting: Oncology

## 2015-08-02 NOTE — Telephone Encounter (Signed)
Patient called in to reschedule her appointment as she has a sick child

## 2015-10-14 ENCOUNTER — Other Ambulatory Visit: Payer: Self-pay

## 2015-10-14 DIAGNOSIS — C50412 Malignant neoplasm of upper-outer quadrant of left female breast: Secondary | ICD-10-CM

## 2015-10-14 DIAGNOSIS — D630 Anemia in neoplastic disease: Secondary | ICD-10-CM

## 2015-10-17 ENCOUNTER — Telehealth: Payer: Self-pay | Admitting: Oncology

## 2015-10-17 ENCOUNTER — Other Ambulatory Visit (HOSPITAL_BASED_OUTPATIENT_CLINIC_OR_DEPARTMENT_OTHER): Payer: Self-pay

## 2015-10-17 ENCOUNTER — Ambulatory Visit (HOSPITAL_BASED_OUTPATIENT_CLINIC_OR_DEPARTMENT_OTHER): Payer: Self-pay | Admitting: Oncology

## 2015-10-17 VITALS — BP 119/66 | HR 77 | Temp 97.9°F | Resp 18 | Ht 67.0 in | Wt 122.7 lb

## 2015-10-17 DIAGNOSIS — Z79810 Long term (current) use of selective estrogen receptor modulators (SERMs): Secondary | ICD-10-CM

## 2015-10-17 DIAGNOSIS — M329 Systemic lupus erythematosus, unspecified: Secondary | ICD-10-CM

## 2015-10-17 DIAGNOSIS — B1920 Unspecified viral hepatitis C without hepatic coma: Secondary | ICD-10-CM

## 2015-10-17 DIAGNOSIS — Z170 Estrogen receptor positive status [ER+]: Secondary | ICD-10-CM

## 2015-10-17 DIAGNOSIS — R768 Other specified abnormal immunological findings in serum: Secondary | ICD-10-CM

## 2015-10-17 DIAGNOSIS — C50412 Malignant neoplasm of upper-outer quadrant of left female breast: Secondary | ICD-10-CM

## 2015-10-17 DIAGNOSIS — D509 Iron deficiency anemia, unspecified: Secondary | ICD-10-CM

## 2015-10-17 DIAGNOSIS — B182 Chronic viral hepatitis C: Secondary | ICD-10-CM | POA: Insufficient documentation

## 2015-10-17 DIAGNOSIS — D630 Anemia in neoplastic disease: Secondary | ICD-10-CM

## 2015-10-17 LAB — CBC WITH DIFFERENTIAL/PLATELET
BASO%: 0 % (ref 0.0–2.0)
BASOS ABS: 0 10*3/uL (ref 0.0–0.1)
EOS%: 1.9 % (ref 0.0–7.0)
Eosinophils Absolute: 0.1 10*3/uL (ref 0.0–0.5)
HCT: 41.4 % (ref 34.8–46.6)
HGB: 13.3 g/dL (ref 11.6–15.9)
LYMPH%: 24.4 % (ref 14.0–49.7)
MCH: 29.8 pg (ref 25.1–34.0)
MCHC: 32.1 g/dL (ref 31.5–36.0)
MCV: 92.8 fL (ref 79.5–101.0)
MONO#: 0.1 10*3/uL (ref 0.1–0.9)
MONO%: 2.5 % (ref 0.0–14.0)
NEUT#: 3.4 10*3/uL (ref 1.5–6.5)
NEUT%: 71.2 % (ref 38.4–76.8)
Platelets: 246 10*3/uL (ref 145–400)
RBC: 4.46 10*6/uL (ref 3.70–5.45)
RDW: 13.1 % (ref 11.2–14.5)
WBC: 4.7 10*3/uL (ref 3.9–10.3)
lymph#: 1.2 10*3/uL (ref 0.9–3.3)

## 2015-10-17 LAB — COMPREHENSIVE METABOLIC PANEL
ALT: 14 U/L (ref 0–55)
AST: 16 U/L (ref 5–34)
Albumin: 3 g/dL — ABNORMAL LOW (ref 3.5–5.0)
Alkaline Phosphatase: 45 U/L (ref 40–150)
Anion Gap: 8 mEq/L (ref 3–11)
BUN: 14.4 mg/dL (ref 7.0–26.0)
CHLORIDE: 107 meq/L (ref 98–109)
CO2: 29 mEq/L (ref 22–29)
Calcium: 8.3 mg/dL — ABNORMAL LOW (ref 8.4–10.4)
Creatinine: 1 mg/dL (ref 0.6–1.1)
EGFR: 64 mL/min/{1.73_m2} — ABNORMAL LOW (ref >90)
Glucose: 112 mg/dl (ref 70–140)
Potassium: 3.5 mEq/L (ref 3.5–5.1)
Sodium: 144 mEq/L (ref 136–145)
Total Bilirubin: <0.3 mg/dL (ref 0.20–1.20)
Total Protein: 6.6 g/dL (ref 6.4–8.3)

## 2015-10-17 LAB — FERRITIN: Ferritin: 42 ng/ml (ref 9–269)

## 2015-10-17 MED ORDER — TAMOXIFEN CITRATE 20 MG PO TABS: 20.0000 mg | ORAL_TABLET | Freq: Every day | ORAL | Status: DC
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
## 2015-10-17 NOTE — Telephone Encounter (Signed)
Appointments made and avs printed for pateint °

## 2015-10-27 ENCOUNTER — Encounter: Payer: Self-pay | Admitting: Oncology

## 2015-10-27 NOTE — Progress Notes (Signed)
No active treatment as of today. Next appt in sept

## 2015-11-23 ENCOUNTER — Other Ambulatory Visit: Payer: Self-pay | Admitting: Oncology

## 2015-11-23 NOTE — Telephone Encounter (Signed)
Chart reviewed.  Pharmacy change

## 2016-06-14 ENCOUNTER — Other Ambulatory Visit (HOSPITAL_BASED_OUTPATIENT_CLINIC_OR_DEPARTMENT_OTHER): Payer: Self-pay

## 2016-06-14 ENCOUNTER — Ambulatory Visit (HOSPITAL_BASED_OUTPATIENT_CLINIC_OR_DEPARTMENT_OTHER): Payer: Self-pay | Admitting: Oncology

## 2016-06-14 VITALS — BP 112/79 | HR 83 | Temp 98.8°F | Resp 18 | Ht 67.0 in | Wt 124.3 lb

## 2016-06-14 DIAGNOSIS — B182 Chronic viral hepatitis C: Secondary | ICD-10-CM

## 2016-06-14 DIAGNOSIS — C50412 Malignant neoplasm of upper-outer quadrant of left female breast: Secondary | ICD-10-CM

## 2016-06-14 DIAGNOSIS — Z79810 Long term (current) use of selective estrogen receptor modulators (SERMs): Secondary | ICD-10-CM

## 2016-06-14 DIAGNOSIS — Z170 Estrogen receptor positive status [ER+]: Secondary | ICD-10-CM

## 2016-06-14 DIAGNOSIS — M329 Systemic lupus erythematosus, unspecified: Secondary | ICD-10-CM

## 2016-06-14 DIAGNOSIS — R768 Other specified abnormal immunological findings in serum: Secondary | ICD-10-CM

## 2016-06-14 DIAGNOSIS — D509 Iron deficiency anemia, unspecified: Secondary | ICD-10-CM

## 2016-06-14 LAB — CBC WITH DIFFERENTIAL/PLATELET
BASO%: 0.1 % (ref 0.0–2.0)
BASOS ABS: 0 10*3/uL (ref 0.0–0.1)
EOS%: 0.5 % (ref 0.0–7.0)
Eosinophils Absolute: 0.1 10*3/uL (ref 0.0–0.5)
HEMATOCRIT: 41.2 % (ref 34.8–46.6)
HEMOGLOBIN: 13.3 g/dL (ref 11.6–15.9)
LYMPH#: 0.7 10*3/uL — AB (ref 0.9–3.3)
LYMPH%: 6.3 % — ABNORMAL LOW (ref 14.0–49.7)
MCH: 30.3 pg (ref 25.1–34.0)
MCHC: 32.3 g/dL (ref 31.5–36.0)
MCV: 93.8 fL (ref 79.5–101.0)
MONO#: 0.2 10*3/uL (ref 0.1–0.9)
MONO%: 2.1 % (ref 0.0–14.0)
NEUT#: 9.4 10*3/uL — ABNORMAL HIGH (ref 1.5–6.5)
NEUT%: 91 % — AB (ref 38.4–76.8)
PLATELETS: 229 10*3/uL (ref 145–400)
RBC: 4.39 10*6/uL (ref 3.70–5.45)
RDW: 12.5 % (ref 11.2–14.5)
WBC: 10.4 10*3/uL — ABNORMAL HIGH (ref 3.9–10.3)

## 2016-06-14 LAB — COMPREHENSIVE METABOLIC PANEL
ALT: 17 U/L (ref 0–55)
ANION GAP: 10 meq/L (ref 3–11)
AST: 16 U/L (ref 5–34)
Albumin: 3.1 g/dL — ABNORMAL LOW (ref 3.5–5.0)
Alkaline Phosphatase: 51 U/L (ref 40–150)
BUN: 9.8 mg/dL (ref 7.0–26.0)
CHLORIDE: 107 meq/L (ref 98–109)
CO2: 27 meq/L (ref 22–29)
Calcium: 8.9 mg/dL (ref 8.4–10.4)
Creatinine: 0.9 mg/dL (ref 0.6–1.1)
EGFR: 71 mL/min/{1.73_m2} — AB (ref >90)
Glucose: 96 mg/dl (ref 70–140)
POTASSIUM: 3.7 meq/L (ref 3.5–5.1)
SODIUM: 144 meq/L (ref 136–145)
Total Bilirubin: <0.3 mg/dL (ref 0.20–1.20)
Total Protein: 7.1 g/dL (ref 6.4–8.3)

## 2016-06-14 LAB — FERRITIN: Ferritin: 82 ng/ml (ref 9–269)
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
## 2016-11-02 ENCOUNTER — Telehealth: Payer: Self-pay | Admitting: *Deleted

## 2016-11-02 NOTE — Telephone Encounter (Signed)
This RN was contacted by pt per her inquiry " at my last visit Dr Jana Hakim said something about another rhuematoid arthritis doctor I could see but I forgot the name "  This RN mentioned Dr Crissie Figures with pt stating she has seen has seen her before " but now doesn't take my insurance "  This RN gave name of Dr Suzette Battiest.  Elen also inquired about medical records " did you guys get that request because my disability was denied and I mean with Lupus, Rheumatoid arthritis and cancer what else do you have to have to be disabled?"  This RN informed pt per above she would need to contact our HIM department for appropriate inquiry.  No other needs at this time.

## 2016-11-13 ENCOUNTER — Other Ambulatory Visit: Payer: Self-pay | Admitting: Oncology

## 2016-12-15 ENCOUNTER — Other Ambulatory Visit: Payer: Self-pay | Admitting: Oncology

## 2016-12-17 ENCOUNTER — Other Ambulatory Visit: Payer: Self-pay | Admitting: Emergency Medicine

## 2016-12-17 MED ORDER — TAMOXIFEN CITRATE 20 MG PO TABS: 20.0000 mg | ORAL_TABLET | Freq: Every day | ORAL | 3 refills | Status: DC

## 2017-01-14 ENCOUNTER — Telehealth: Payer: Self-pay

## 2017-01-14 ENCOUNTER — Other Ambulatory Visit: Payer: Self-pay | Admitting: Oncology

## 2017-01-14 NOTE — Telephone Encounter (Signed)
Message left on pt's mobile number regarding need to know which pharmacy pt uses for her Tamoxifen prescription as we have 2 different pharmacies on file.

## 2017-01-14 NOTE — Telephone Encounter (Signed)
Spoke with pt and confirmed correct pharmacy for Tamoxifen refill is [facility].  Also confirmed with pt her upcoming appt's in Sept.

## 2017-06-12 ENCOUNTER — Other Ambulatory Visit: Payer: Self-pay

## 2017-06-12 DIAGNOSIS — C50412 Malignant neoplasm of upper-outer quadrant of left female breast: Secondary | ICD-10-CM

## 2017-06-13 ENCOUNTER — Other Ambulatory Visit (HOSPITAL_BASED_OUTPATIENT_CLINIC_OR_DEPARTMENT_OTHER): Payer: Self-pay

## 2017-06-13 ENCOUNTER — Ambulatory Visit (HOSPITAL_BASED_OUTPATIENT_CLINIC_OR_DEPARTMENT_OTHER): Payer: Self-pay | Admitting: Oncology

## 2017-06-13 ENCOUNTER — Encounter: Payer: Self-pay | Admitting: Oncology

## 2017-06-13 VITALS — BP 111/76 | HR 91 | Temp 98.7°F | Resp 20 | Ht 67.0 in | Wt 117.3 lb

## 2017-06-13 DIAGNOSIS — Z79810 Long term (current) use of selective estrogen receptor modulators (SERMs): Secondary | ICD-10-CM

## 2017-06-13 DIAGNOSIS — C50412 Malignant neoplasm of upper-outer quadrant of left female breast: Secondary | ICD-10-CM

## 2017-06-13 DIAGNOSIS — M069 Rheumatoid arthritis, unspecified: Secondary | ICD-10-CM | POA: Insufficient documentation

## 2017-06-13 DIAGNOSIS — Z170 Estrogen receptor positive status [ER+]: Secondary | ICD-10-CM

## 2017-06-13 DIAGNOSIS — M329 Systemic lupus erythematosus, unspecified: Secondary | ICD-10-CM | POA: Insufficient documentation

## 2017-06-13 DIAGNOSIS — D0501 Lobular carcinoma in situ of right breast: Secondary | ICD-10-CM

## 2017-06-13 LAB — CBC WITH DIFFERENTIAL/PLATELET
BASO%: 0.1 % (ref 0.0–2.0)
BASOS ABS: 0 10*3/uL (ref 0.0–0.1)
EOS ABS: 0.1 10*3/uL (ref 0.0–0.5)
EOS%: 1.3 % (ref 0.0–7.0)
HEMATOCRIT: 39.9 % (ref 34.8–46.6)
HEMOGLOBIN: 12.9 g/dL (ref 11.6–15.9)
LYMPH#: 0.7 10*3/uL — AB (ref 0.9–3.3)
LYMPH%: 8.7 % — ABNORMAL LOW (ref 14.0–49.7)
MCH: 30.5 pg (ref 25.1–34.0)
MCHC: 32.3 g/dL (ref 31.5–36.0)
MCV: 94.3 fL (ref 79.5–101.0)
MONO#: 0.2 10*3/uL (ref 0.1–0.9)
MONO%: 1.9 % (ref 0.0–14.0)
NEUT#: 6.9 10*3/uL — ABNORMAL HIGH (ref 1.5–6.5)
NEUT%: 88 % — AB (ref 38.4–76.8)
NRBC: 0 % (ref 0–0)
PLATELETS: 250 10*3/uL (ref 145–400)
RBC: 4.23 10*6/uL (ref 3.70–5.45)
RDW: 12.7 % (ref 11.2–14.5)
WBC: 7.8 10*3/uL (ref 3.9–10.3)

## 2017-06-13 LAB — COMPREHENSIVE METABOLIC PANEL
ALBUMIN: 3 g/dL — AB (ref 3.5–5.0)
ALK PHOS: 45 U/L (ref 40–150)
ALT: 16 U/L (ref 0–55)
AST: 20 U/L (ref 5–34)
Anion Gap: 7 mEq/L (ref 3–11)
BILIRUBIN TOTAL: 0.32 mg/dL (ref 0.20–1.20)
BUN: 8.2 mg/dL (ref 7.0–26.0)
CALCIUM: 8.9 mg/dL (ref 8.4–10.4)
CO2: 28 mEq/L (ref 22–29)
Chloride: 107 mEq/L (ref 98–109)
Creatinine: 1 mg/dL (ref 0.6–1.1)
EGFR: 66 mL/min/{1.73_m2} — AB (ref >90)
GLUCOSE: 113 mg/dL (ref 70–140)
Potassium: 4.4 mEq/L (ref 3.5–5.1)
SODIUM: 142 meq/L (ref 136–145)
TOTAL PROTEIN: 6.7 g/dL (ref 6.4–8.3)

## 2017-06-13 MED ORDER — TAMOXIFEN CITRATE 20 MG PO TABS: 20.0000 mg | ORAL_TABLET | Freq: Every day | ORAL | 4 refills | Status: DC
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
## 2017-07-18 ENCOUNTER — Other Ambulatory Visit: Payer: Self-pay | Admitting: Oncology

## 2017-08-19 ENCOUNTER — Other Ambulatory Visit: Payer: Self-pay | Admitting: Oncology

## 2017-09-04 ENCOUNTER — Other Ambulatory Visit: Payer: Self-pay | Admitting: *Deleted

## 2017-09-04 DIAGNOSIS — M329 Systemic lupus erythematosus, unspecified: Secondary | ICD-10-CM

## 2017-09-12 ENCOUNTER — Other Ambulatory Visit: Payer: Self-pay

## 2017-09-12 DIAGNOSIS — M329 Systemic lupus erythematosus, unspecified: Secondary | ICD-10-CM

## 2017-09-13 ENCOUNTER — Telehealth: Payer: Self-pay

## 2017-09-13 NOTE — Telephone Encounter (Signed)
New pt referral information faxed to [facility]

## 2017-09-16 ENCOUNTER — Other Ambulatory Visit: Payer: Self-pay | Admitting: Oncology

## 2017-09-19 ENCOUNTER — Other Ambulatory Visit: Payer: Self-pay | Admitting: *Deleted

## 2017-10-12 ENCOUNTER — Other Ambulatory Visit: Payer: Self-pay | Admitting: Oncology

## 2017-10-29 ENCOUNTER — Telehealth: Payer: Self-pay | Admitting: Oncology

## 2017-10-29 NOTE — Telephone Encounter (Signed)
Faxed records [place] [facility] [phone]

## 2017-11-01 ENCOUNTER — Telehealth: Payer: Self-pay

## 2017-11-01 ENCOUNTER — Other Ambulatory Visit: Payer: Self-pay

## 2017-11-01 DIAGNOSIS — M069 Rheumatoid arthritis, unspecified: Secondary | ICD-10-CM

## 2017-11-01 MED ORDER — TAMOXIFEN CITRATE 20 MG PO TABS: 20.0000 mg | ORAL_TABLET | Freq: Every day | ORAL | 3 refills | Status: DC

## 2017-11-01 NOTE — Telephone Encounter (Signed)
Received call from patient's father Sonia Side [phone] letting us know that the rheumatology referral was denied and that Dr Serafina Mitchell office suggested "[facility]" and per family, [facility] location is preferred.  Received referral for rheumatology per Dr Jana Hakim.  Notified "Amy" at [place], [facility] office and their fax is [phone]. They will contact pt with appointment and financial counseling first, no insurance noted.  Sent in-basket to [employer] with HIM to send pt's records.

## 2018-05-08 ENCOUNTER — Other Ambulatory Visit: Payer: Self-pay

## 2018-05-08 DIAGNOSIS — E2839 Other primary ovarian failure: Secondary | ICD-10-CM

## 2018-05-08 NOTE — Progress Notes (Signed)
Pt called to see if she can stop tamoxifen per her eye dr suggestion. Pt has worsening of cataracts and new onset of glaucoma. She was advised that tamoxifen can make cataracts worse. Discussed with Dr.Magrinat and would like to have pt stop tamoxifen now and start anastrozole daily on 06/01/18. Pt was also told that the cataracts will not shrink if she stops tamoxifen, but it will not get any more worse. Pt will also need a bone density test before starting anastrozole. Obtained order from [place] and will have to get it done in the next few weeks. Pt confirmed next appt with Dr,Magrinat next month. No further needs at this time.

## 2018-06-10 ENCOUNTER — Telehealth: Payer: Self-pay | Admitting: Oncology

## 2018-06-10 NOTE — Telephone Encounter (Signed)
[facility] PAL - moved 9/18 fu from [facility] to [place]. Left message for patient. Schedule mailed.

## 2018-06-18 ENCOUNTER — Inpatient Hospital Stay: Payer: Medicaid Other

## 2018-06-18 ENCOUNTER — Inpatient Hospital Stay: Payer: Medicaid Other | Attending: Oncology | Admitting: Adult Health

## 2018-06-18 ENCOUNTER — Encounter: Payer: Self-pay | Admitting: Adult Health

## 2018-06-18 ENCOUNTER — Telehealth: Payer: Self-pay | Admitting: Adult Health

## 2018-06-18 VITALS — BP 139/74 | HR 89 | Temp 98.9°F | Resp 17 | Ht 67.0 in | Wt 100.5 lb

## 2018-06-18 DIAGNOSIS — Z9013 Acquired absence of bilateral breasts and nipples: Secondary | ICD-10-CM | POA: Diagnosis not present

## 2018-06-18 DIAGNOSIS — Z9221 Personal history of antineoplastic chemotherapy: Secondary | ICD-10-CM | POA: Diagnosis not present

## 2018-06-18 DIAGNOSIS — M329 Systemic lupus erythematosus, unspecified: Secondary | ICD-10-CM

## 2018-06-18 DIAGNOSIS — M069 Rheumatoid arthritis, unspecified: Secondary | ICD-10-CM

## 2018-06-18 DIAGNOSIS — Z170 Estrogen receptor positive status [ER+]: Secondary | ICD-10-CM

## 2018-06-18 DIAGNOSIS — C50412 Malignant neoplasm of upper-outer quadrant of left female breast: Secondary | ICD-10-CM

## 2018-06-18 DIAGNOSIS — Z923 Personal history of irradiation: Secondary | ICD-10-CM | POA: Diagnosis not present

## 2018-06-18 LAB — COMPREHENSIVE METABOLIC PANEL
ALT: 14 U/L (ref 0–44)
AST: 19 U/L (ref 15–41)
Albumin: 2.6 g/dL — ABNORMAL LOW (ref 3.5–5.0)
Alkaline Phosphatase: 55 U/L (ref 38–126)
Anion gap: 7 (ref 5–15)
BUN: 10 mg/dL (ref 6–20)
CHLORIDE: 108 mmol/L (ref 98–111)
CO2: 27 mmol/L (ref 22–32)
Calcium: 8.3 mg/dL — ABNORMAL LOW (ref 8.9–10.3)
Creatinine, Ser: 0.79 mg/dL (ref 0.44–1.00)
Glucose, Bld: 128 mg/dL — ABNORMAL HIGH (ref 70–99)
POTASSIUM: 3.6 mmol/L (ref 3.5–5.1)
Sodium: 142 mmol/L (ref 135–145)
Total Bilirubin: 0.2 mg/dL — ABNORMAL LOW (ref 0.3–1.2)
Total Protein: 6.3 g/dL — ABNORMAL LOW (ref 6.5–8.1)

## 2018-06-18 LAB — CBC WITH DIFFERENTIAL/PLATELET
BASOS ABS: 0.1 10*3/uL (ref 0.0–0.1)
Basophils Relative: 1 %
EOS PCT: 2 %
Eosinophils Absolute: 0.2 10*3/uL (ref 0.0–0.5)
HCT: 34.9 % (ref 34.8–46.6)
Hemoglobin: 11.5 g/dL — ABNORMAL LOW (ref 11.6–15.9)
LYMPHS ABS: 0.7 10*3/uL — AB (ref 0.9–3.3)
LYMPHS PCT: 7 %
MCH: 29.3 pg (ref 25.1–34.0)
MCHC: 33 g/dL (ref 31.5–36.0)
MCV: 88.8 fL (ref 79.5–101.0)
MONO ABS: 0.1 10*3/uL (ref 0.1–0.9)
Monocytes Relative: 1 %
Neutro Abs: 8.4 10*3/uL — ABNORMAL HIGH (ref 1.5–6.5)
Neutrophils Relative %: 89 %
PLATELETS: 298 10*3/uL (ref 145–400)
RBC: 3.93 MIL/uL (ref 3.70–5.45)
RDW: 13.7 % (ref 11.2–14.5)
WBC: 9.4 10*3/uL (ref 3.9–10.3)

## 2018-06-18 MED ORDER — ANASTROZOLE 1 MG PO TABS: 1.0000 mg | ORAL_TABLET | Freq: Every day | ORAL | 2 refills | Status: DC

## 2018-06-18 NOTE — Telephone Encounter (Signed)
Gave patient avs and calendar.   °
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
## 2018-06-30 ENCOUNTER — Encounter: Payer: Self-pay | Admitting: Oncology

## 2018-06-30 ENCOUNTER — Other Ambulatory Visit: Payer: Self-pay | Admitting: Oncology

## 2018-06-30 DIAGNOSIS — M0579 Rheumatoid arthritis with rheumatoid factor of multiple sites without organ or systems involvement: Secondary | ICD-10-CM

## 2018-06-30 DIAGNOSIS — M329 Systemic lupus erythematosus, unspecified: Secondary | ICD-10-CM

## 2018-07-01 ENCOUNTER — Telehealth: Payer: Self-pay | Admitting: Oncology

## 2018-07-01 NOTE — Telephone Encounter (Signed)
Faxed medical records to [facility], Release ID: [id number]

## 2018-07-21 ENCOUNTER — Telehealth: Payer: Self-pay | Admitting: *Deleted

## 2018-07-21 NOTE — Telephone Encounter (Signed)
This RN spoke with pt per VM - with message stating she will not be seen by a rheumatologist until 09/01/2018 " and I need something for my pain ".  " Dr Jana Hakim I thought was going to refer me to Dr Amil Amen and now I have an appointment with Dr Trudie Reed for 12/2"  Zellie states she is having difficulty with ADL's including " my husband has to help lift me when I am sitting down off  the sofa "  This RN validated concern for issues she is having but that ideally she needs to obtain medication from the most appropriate source.  Margarite states she was being seen at [facility] " but they took me off the plaquenil last May and then my doctor was on maternity leave and I saw her assistant who said I probably should be on another medication but that it cost $ 1200 a bottle "  " he was going to discuss the medication with another doctor and get back with me but then never "  " I haven't heard from them since "   This RN inquired about follow up with her primary MD with  [facility] stating she has not seen her primary MD " because I just gave up "  She states she using tramadol " which helps minimally ".  " I am calling Dr Jana Hakim since he is making the referral to this other doctor who cannot see me until December 2 "  This RN informed pt - her concerns will be given to MD for review.  Return call number given as [phone].

## 2018-07-22 ENCOUNTER — Other Ambulatory Visit: Payer: Self-pay | Admitting: Oncology

## 2018-07-22 MED ORDER — HYDROXYCHLOROQUINE SULFATE 200 MG PO TABS: 200.0000 mg | ORAL_TABLET | Freq: Every day | ORAL | 2 refills | Status: AC

## 2018-07-22 MED ORDER — PREDNISONE 5 MG PO TABS: 5.0000 mg | ORAL_TABLET | Freq: Every day | ORAL | 1 refills | Status: AC

## 2018-07-22 NOTE — Progress Notes (Signed)
Kendra Sullivan was taken off Plaquenil because of concerns regarding her vision.  She has also taken herself off prednisone.  The result is that she cannot move.  She is in terrific pain.  Her husband has to help her on and off chairs.  She got a prescription for tramadol from Dr. Evalee Mutton but that has not helped.  She is scheduled to see Dr. Lenna Gilford 09/01/2018.  I discussed the situation with [name] today.  She is going to go back on Plaquenil 200 mg daily.  She will also take prednisone 10 mg today and then 5 mg every morning.  I am hoping by doing this she will be able to get back to better pain control without having to go to narcotics.  At the same time we are going to interrupt anastrozole which might be contributing to her discomfort.  I asked her to call in a couple of days and let us know how this is working.  Incidentally she has been found to have glaucoma and cataracts and she is being treated for the glaucoma but not set up for cataract surgery yet
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
## 2018-09-22 ENCOUNTER — Inpatient Hospital Stay (HOSPITAL_BASED_OUTPATIENT_CLINIC_OR_DEPARTMENT_OTHER): Payer: Medicaid Other | Admitting: Oncology

## 2018-09-22 ENCOUNTER — Telehealth: Payer: Self-pay | Admitting: Oncology

## 2018-09-22 ENCOUNTER — Inpatient Hospital Stay: Payer: Medicaid Other | Attending: Oncology

## 2018-09-22 VITALS — BP 120/78 | HR 85 | Temp 98.3°F | Resp 18 | Ht 67.0 in | Wt 95.3 lb

## 2018-09-22 DIAGNOSIS — M329 Systemic lupus erythematosus, unspecified: Secondary | ICD-10-CM

## 2018-09-22 DIAGNOSIS — Z79899 Other long term (current) drug therapy: Secondary | ICD-10-CM | POA: Insufficient documentation

## 2018-09-22 DIAGNOSIS — Z79811 Long term (current) use of aromatase inhibitors: Secondary | ICD-10-CM

## 2018-09-22 DIAGNOSIS — D509 Iron deficiency anemia, unspecified: Secondary | ICD-10-CM

## 2018-09-22 DIAGNOSIS — C50412 Malignant neoplasm of upper-outer quadrant of left female breast: Secondary | ICD-10-CM

## 2018-09-22 DIAGNOSIS — Z803 Family history of malignant neoplasm of breast: Secondary | ICD-10-CM | POA: Diagnosis not present

## 2018-09-22 DIAGNOSIS — Z923 Personal history of irradiation: Secondary | ICD-10-CM | POA: Diagnosis not present

## 2018-09-22 DIAGNOSIS — M069 Rheumatoid arthritis, unspecified: Secondary | ICD-10-CM

## 2018-09-22 DIAGNOSIS — Z170 Estrogen receptor positive status [ER+]: Secondary | ICD-10-CM

## 2018-09-22 DIAGNOSIS — B1920 Unspecified viral hepatitis C without hepatic coma: Secondary | ICD-10-CM

## 2018-09-22 DIAGNOSIS — Z9221 Personal history of antineoplastic chemotherapy: Secondary | ICD-10-CM

## 2018-09-22 DIAGNOSIS — Z800 Family history of malignant neoplasm of digestive organs: Secondary | ICD-10-CM | POA: Insufficient documentation

## 2018-09-22 DIAGNOSIS — Z9013 Acquired absence of bilateral breasts and nipples: Secondary | ICD-10-CM | POA: Insufficient documentation

## 2018-09-22 LAB — CBC WITH DIFFERENTIAL/PLATELET
ABS IMMATURE GRANULOCYTES: 0.02 10*3/uL (ref 0.00–0.07)
BASOS ABS: 0 10*3/uL (ref 0.0–0.1)
Basophils Relative: 0 %
Eosinophils Absolute: 0.1 10*3/uL (ref 0.0–0.5)
Eosinophils Relative: 1 %
HCT: 38 % (ref 36.0–46.0)
Hemoglobin: 11.6 g/dL — ABNORMAL LOW (ref 12.0–15.0)
IMMATURE GRANULOCYTES: 0 %
Lymphocytes Relative: 8 %
Lymphs Abs: 0.7 10*3/uL (ref 0.7–4.0)
MCH: 26.9 pg (ref 26.0–34.0)
MCHC: 30.5 g/dL (ref 30.0–36.0)
MCV: 88.2 fL (ref 80.0–100.0)
MONOS PCT: 1 %
Monocytes Absolute: 0.1 10*3/uL (ref 0.1–1.0)
NEUTROS ABS: 7.4 10*3/uL (ref 1.7–7.7)
NEUTROS PCT: 90 %
Platelets: 327 10*3/uL (ref 150–400)
RBC: 4.31 MIL/uL (ref 3.87–5.11)
RDW: 15.1 % (ref 11.5–15.5)
WBC: 8.3 10*3/uL (ref 4.0–10.5)
nRBC: 0 % (ref 0.0–0.2)

## 2018-09-22 LAB — COMPREHENSIVE METABOLIC PANEL
ALBUMIN: 3 g/dL — AB (ref 3.5–5.0)
ALT: 14 U/L (ref 0–44)
AST: 15 U/L (ref 15–41)
Alkaline Phosphatase: 82 U/L (ref 38–126)
Anion gap: 6 (ref 5–15)
BUN: 12 mg/dL (ref 6–20)
CHLORIDE: 109 mmol/L (ref 98–111)
CO2: 25 mmol/L (ref 22–32)
CREATININE: 0.81 mg/dL (ref 0.44–1.00)
Calcium: 8.6 mg/dL — ABNORMAL LOW (ref 8.9–10.3)
GFR calc Af Amer: >60 mL/min (ref >60)
GFR calc non Af Amer: >60 mL/min (ref >60)
GLUCOSE: 103 mg/dL — AB (ref 70–99)
Potassium: 3.8 mmol/L (ref 3.5–5.1)
SODIUM: 140 mmol/L (ref 135–145)
Total Bilirubin: 0.3 mg/dL (ref 0.3–1.2)
Total Protein: 6.8 g/dL (ref 6.5–8.1)

## 2018-09-22 MED ORDER — ANASTROZOLE 1 MG PO TABS: 1.0000 mg | ORAL_TABLET | Freq: Every day | ORAL | 2 refills | Status: DC

## 2018-09-22 NOTE — Telephone Encounter (Signed)
Gave avs and calendar ° °

## 2018-12-29 ENCOUNTER — Other Ambulatory Visit: Payer: Self-pay | Admitting: Oncology

## 2018-12-29 DIAGNOSIS — Z170 Estrogen receptor positive status [ER+]: Principal | ICD-10-CM

## 2018-12-29 DIAGNOSIS — C50412 Malignant neoplasm of upper-outer quadrant of left female breast: Secondary | ICD-10-CM

## 2019-03-15 ENCOUNTER — Other Ambulatory Visit: Payer: Self-pay | Admitting: Oncology

## 2019-03-15 DIAGNOSIS — C50412 Malignant neoplasm of upper-outer quadrant of left female breast: Secondary | ICD-10-CM

## 2019-03-17 ENCOUNTER — Other Ambulatory Visit: Payer: Self-pay

## 2019-03-17 ENCOUNTER — Inpatient Hospital Stay: Payer: Self-pay | Attending: Oncology

## 2019-03-17 ENCOUNTER — Inpatient Hospital Stay (HOSPITAL_BASED_OUTPATIENT_CLINIC_OR_DEPARTMENT_OTHER): Payer: Self-pay | Admitting: Oncology

## 2019-03-17 VITALS — BP 113/75 | HR 101 | Temp 98.7°F | Resp 18 | Ht 67.0 in | Wt 99.1 lb

## 2019-03-17 DIAGNOSIS — Z79899 Other long term (current) drug therapy: Secondary | ICD-10-CM

## 2019-03-17 DIAGNOSIS — Z803 Family history of malignant neoplasm of breast: Secondary | ICD-10-CM

## 2019-03-17 DIAGNOSIS — Z79811 Long term (current) use of aromatase inhibitors: Secondary | ICD-10-CM | POA: Insufficient documentation

## 2019-03-17 DIAGNOSIS — B182 Chronic viral hepatitis C: Secondary | ICD-10-CM

## 2019-03-17 DIAGNOSIS — Z9013 Acquired absence of bilateral breasts and nipples: Secondary | ICD-10-CM | POA: Insufficient documentation

## 2019-03-17 DIAGNOSIS — M069 Rheumatoid arthritis, unspecified: Secondary | ICD-10-CM | POA: Insufficient documentation

## 2019-03-17 DIAGNOSIS — C50412 Malignant neoplasm of upper-outer quadrant of left female breast: Secondary | ICD-10-CM

## 2019-03-17 DIAGNOSIS — B1920 Unspecified viral hepatitis C without hepatic coma: Secondary | ICD-10-CM

## 2019-03-17 DIAGNOSIS — Z170 Estrogen receptor positive status [ER+]: Secondary | ICD-10-CM | POA: Insufficient documentation

## 2019-03-17 DIAGNOSIS — M329 Systemic lupus erythematosus, unspecified: Secondary | ICD-10-CM

## 2019-03-17 DIAGNOSIS — D508 Other iron deficiency anemias: Secondary | ICD-10-CM

## 2019-03-17 LAB — COMPREHENSIVE METABOLIC PANEL
ALT: 18 U/L (ref 0–44)
AST: 20 U/L (ref 15–41)
Albumin: 2.9 g/dL — ABNORMAL LOW (ref 3.5–5.0)
Alkaline Phosphatase: 70 U/L (ref 38–126)
Anion gap: 9 (ref 5–15)
BUN: 10 mg/dL (ref 6–20)
CO2: 28 mmol/L (ref 22–32)
Calcium: 8.2 mg/dL — ABNORMAL LOW (ref 8.9–10.3)
Chloride: 104 mmol/L (ref 98–111)
Creatinine, Ser: 0.92 mg/dL (ref 0.44–1.00)
GFR calc Af Amer: >60 mL/min (ref >60)
GFR calc non Af Amer: >60 mL/min (ref >60)
Glucose, Bld: 97 mg/dL (ref 70–99)
Potassium: 4.3 mmol/L (ref 3.5–5.1)
Sodium: 141 mmol/L (ref 135–145)
Total Bilirubin: <0.2 mg/dL — ABNORMAL LOW (ref 0.3–1.2)
Total Protein: 7 g/dL (ref 6.5–8.1)

## 2019-03-17 LAB — CBC WITH DIFFERENTIAL/PLATELET
Abs Immature Granulocytes: 0.02 10*3/uL (ref 0.00–0.07)
Basophils Absolute: 0 10*3/uL (ref 0.0–0.1)
Basophils Relative: 0 %
Eosinophils Absolute: 0.3 10*3/uL (ref 0.0–0.5)
Eosinophils Relative: 4 %
HCT: 38.5 % (ref 36.0–46.0)
Hemoglobin: 12 g/dL (ref 12.0–15.0)
Immature Granulocytes: 0 %
Lymphocytes Relative: 16 %
Lymphs Abs: 1.3 10*3/uL (ref 0.7–4.0)
MCH: 27.6 pg (ref 26.0–34.0)
MCHC: 31.2 g/dL (ref 30.0–36.0)
MCV: 88.5 fL (ref 80.0–100.0)
Monocytes Absolute: 0.2 10*3/uL (ref 0.1–1.0)
Monocytes Relative: 3 %
Neutro Abs: 6.2 10*3/uL (ref 1.7–7.7)
Neutrophils Relative %: 77 %
Platelets: 344 10*3/uL (ref 150–400)
RBC: 4.35 MIL/uL (ref 3.87–5.11)
RDW: 13.2 % (ref 11.5–15.5)
WBC: 8 10*3/uL (ref 4.0–10.5)
nRBC: 0 % (ref 0.0–0.2)
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
## 2019-03-18 LAB — IRON AND TIBC
Iron: 28 ug/dL — ABNORMAL LOW (ref 41–142)
Saturation Ratios: 10 % — ABNORMAL LOW (ref 21–57)
TIBC: 288 ug/dL (ref 236–444)
UIBC: 260 ug/dL (ref 120–384)

## 2019-03-18 LAB — FERRITIN: Ferritin: 104 ng/mL (ref 11–307)

## 2019-07-07 ENCOUNTER — Other Ambulatory Visit: Payer: Self-pay | Admitting: Oncology

## 2019-07-07 DIAGNOSIS — C50412 Malignant neoplasm of upper-outer quadrant of left female breast: Secondary | ICD-10-CM

## 2019-07-07 DIAGNOSIS — Z170 Estrogen receptor positive status [ER+]: Secondary | ICD-10-CM

## 2019-10-04 ENCOUNTER — Other Ambulatory Visit: Payer: Self-pay | Admitting: Oncology

## 2019-10-04 DIAGNOSIS — Z170 Estrogen receptor positive status [ER+]: Secondary | ICD-10-CM

## 2019-10-04 DIAGNOSIS — C50412 Malignant neoplasm of upper-outer quadrant of left female breast: Secondary | ICD-10-CM

## 2019-12-26 ENCOUNTER — Other Ambulatory Visit: Payer: Self-pay | Admitting: Oncology

## 2019-12-26 DIAGNOSIS — C50412 Malignant neoplasm of upper-outer quadrant of left female breast: Secondary | ICD-10-CM

## 2019-12-26 DIAGNOSIS — Z170 Estrogen receptor positive status [ER+]: Secondary | ICD-10-CM

## 2020-03-04 ENCOUNTER — Other Ambulatory Visit: Payer: Self-pay | Admitting: Oncology

## 2020-03-04 DIAGNOSIS — C50412 Malignant neoplasm of upper-outer quadrant of left female breast: Secondary | ICD-10-CM
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
## 2020-03-16 ENCOUNTER — Inpatient Hospital Stay: Payer: PRIVATE HEALTH INSURANCE | Attending: Oncology | Admitting: Oncology

## 2020-03-16 ENCOUNTER — Other Ambulatory Visit: Payer: Self-pay

## 2020-03-16 ENCOUNTER — Inpatient Hospital Stay: Payer: PRIVATE HEALTH INSURANCE

## 2020-03-16 VITALS — BP 118/83 | HR 95 | Temp 98.0°F | Resp 18 | Ht 67.0 in | Wt 105.4 lb

## 2020-03-16 DIAGNOSIS — Z803 Family history of malignant neoplasm of breast: Secondary | ICD-10-CM | POA: Diagnosis not present

## 2020-03-16 DIAGNOSIS — Z800 Family history of malignant neoplasm of digestive organs: Secondary | ICD-10-CM | POA: Insufficient documentation

## 2020-03-16 DIAGNOSIS — Z170 Estrogen receptor positive status [ER+]: Secondary | ICD-10-CM | POA: Diagnosis not present

## 2020-03-16 DIAGNOSIS — M329 Systemic lupus erythematosus, unspecified: Secondary | ICD-10-CM | POA: Diagnosis not present

## 2020-03-16 DIAGNOSIS — C50412 Malignant neoplasm of upper-outer quadrant of left female breast: Secondary | ICD-10-CM

## 2020-03-16 DIAGNOSIS — M069 Rheumatoid arthritis, unspecified: Secondary | ICD-10-CM | POA: Diagnosis not present

## 2020-03-16 DIAGNOSIS — B1920 Unspecified viral hepatitis C without hepatic coma: Secondary | ICD-10-CM | POA: Insufficient documentation

## 2020-03-16 DIAGNOSIS — Z9221 Personal history of antineoplastic chemotherapy: Secondary | ICD-10-CM | POA: Insufficient documentation

## 2020-03-16 DIAGNOSIS — Z853 Personal history of malignant neoplasm of breast: Secondary | ICD-10-CM | POA: Insufficient documentation

## 2020-03-16 DIAGNOSIS — Z9013 Acquired absence of bilateral breasts and nipples: Secondary | ICD-10-CM | POA: Diagnosis not present

## 2020-03-16 DIAGNOSIS — Z79899 Other long term (current) drug therapy: Secondary | ICD-10-CM | POA: Insufficient documentation

## 2020-03-16 DIAGNOSIS — D508 Other iron deficiency anemias: Secondary | ICD-10-CM

## 2020-03-16 LAB — CBC WITH DIFFERENTIAL/PLATELET
Abs Immature Granulocytes: 0.01 10*3/uL (ref 0.00–0.07)
Basophils Absolute: 0 10*3/uL (ref 0.0–0.1)
Basophils Relative: 0 %
Eosinophils Absolute: 0.2 10*3/uL (ref 0.0–0.5)
Eosinophils Relative: 3 %
HCT: 42.2 % (ref 36.0–46.0)
Hemoglobin: 13.4 g/dL (ref 12.0–15.0)
Immature Granulocytes: 0 %
Lymphocytes Relative: 42 %
Lymphs Abs: 2.5 10*3/uL (ref 0.7–4.0)
MCH: 28.6 pg (ref 26.0–34.0)
MCHC: 31.8 g/dL (ref 30.0–36.0)
MCV: 90 fL (ref 80.0–100.0)
Monocytes Absolute: 0.4 10*3/uL (ref 0.1–1.0)
Monocytes Relative: 7 %
Neutro Abs: 2.8 10*3/uL (ref 1.7–7.7)
Neutrophils Relative %: 48 %
Platelets: 261 10*3/uL (ref 150–400)
RBC: 4.69 MIL/uL (ref 3.87–5.11)
RDW: 12.9 % (ref 11.5–15.5)
WBC: 5.8 10*3/uL (ref 4.0–10.5)
nRBC: 0 % (ref 0.0–0.2)

## 2020-03-16 LAB — COMPREHENSIVE METABOLIC PANEL
ALT: 17 U/L (ref 0–44)
AST: 18 U/L (ref 15–41)
Albumin: 3.2 g/dL — ABNORMAL LOW (ref 3.5–5.0)
Alkaline Phosphatase: 80 U/L (ref 38–126)
Anion gap: 8 (ref 5–15)
BUN: 13 mg/dL (ref 6–20)
CO2: 29 mmol/L (ref 22–32)
Calcium: 9 mg/dL (ref 8.9–10.3)
Chloride: 106 mmol/L (ref 98–111)
Creatinine, Ser: 0.92 mg/dL (ref 0.44–1.00)
GFR calc Af Amer: >60 mL/min (ref >60)
GFR calc non Af Amer: >60 mL/min (ref >60)
Glucose, Bld: 75 mg/dL (ref 70–99)
Potassium: 4.1 mmol/L (ref 3.5–5.1)
Sodium: 143 mmol/L (ref 135–145)
Total Bilirubin: 0.2 mg/dL — ABNORMAL LOW (ref 0.3–1.2)
Total Protein: 7.4 g/dL (ref 6.5–8.1)

## 2020-03-17 ENCOUNTER — Telehealth: Payer: Self-pay | Admitting: Oncology

## 2020-03-17 NOTE — Telephone Encounter (Signed)
No 5/16 los. No changes made to pt's schedule.

## 2020-03-18 LAB — HCV RNA QUANT RFLX ULTRA OR GENOTYP
HCV RNA Qnt(log copy/mL): 5.356 log10 IU/mL
HepC Qn: 227000 IU/mL

## 2020-03-18 LAB — HEPATITIS C GENOTYPE

## 2020-03-19 ENCOUNTER — Encounter: Payer: Self-pay | Admitting: Oncology
